# Patient Record
Sex: Female | Born: 1975 | Hispanic: No | Marital: Married | Smoking: Never smoker
Health system: Southern US, Community
[De-identification: ages and names within clinical notes are randomized; demographics above are authoritative.]

## PROBLEM LIST (undated history)

## (undated) DIAGNOSIS — M549 Dorsalgia, unspecified: Secondary | ICD-10-CM

## (undated) DIAGNOSIS — F32A Depression, unspecified: Secondary | ICD-10-CM

## (undated) DIAGNOSIS — G8929 Other chronic pain: Secondary | ICD-10-CM

## (undated) DIAGNOSIS — F419 Anxiety disorder, unspecified: Secondary | ICD-10-CM

## (undated) DIAGNOSIS — I639 Cerebral infarction, unspecified: Secondary | ICD-10-CM

---

## 2009-06-03 ENCOUNTER — Other Ambulatory Visit: Admission: RE | Admit: 2009-06-03 | Discharge: 2009-06-03 | Payer: Self-pay | Admitting: Family Medicine

## 2013-03-09 ENCOUNTER — Other Ambulatory Visit (HOSPITAL_COMMUNITY)
Admission: RE | Admit: 2013-03-09 | Discharge: 2013-03-09 | Disposition: A | Discharge status: Home / Self Care | Payer: Managed Care, Other (non HMO) | Source: Ambulatory Visit | Attending: Family Medicine | Admitting: Family Medicine

## 2013-03-09 ENCOUNTER — Other Ambulatory Visit: Payer: Self-pay | Admitting: Family Medicine

## 2013-03-09 DIAGNOSIS — Z124 Encounter for screening for malignant neoplasm of cervix: Secondary | ICD-10-CM | POA: Insufficient documentation

## 2015-02-11 ENCOUNTER — Emergency Department (HOSPITAL_COMMUNITY): Payer: Managed Care, Other (non HMO)

## 2015-02-11 ENCOUNTER — Emergency Department (HOSPITAL_COMMUNITY)
Admission: EM | Admit: 2015-02-11 | Discharge: 2015-02-11 | Disposition: A | Discharge status: Home / Self Care | Payer: Managed Care, Other (non HMO) | Attending: Emergency Medicine | Admitting: Emergency Medicine

## 2015-02-11 ENCOUNTER — Encounter (HOSPITAL_COMMUNITY): Payer: Self-pay | Admitting: *Deleted

## 2015-02-11 DIAGNOSIS — G8929 Other chronic pain: Secondary | ICD-10-CM | POA: Insufficient documentation

## 2015-02-11 DIAGNOSIS — M545 Low back pain, unspecified: Secondary | ICD-10-CM

## 2015-02-11 DIAGNOSIS — Z79899 Other long term (current) drug therapy: Secondary | ICD-10-CM | POA: Insufficient documentation

## 2015-02-11 DIAGNOSIS — Z3202 Encounter for pregnancy test, result negative: Secondary | ICD-10-CM | POA: Diagnosis not present

## 2015-02-11 DIAGNOSIS — M549 Dorsalgia, unspecified: Secondary | ICD-10-CM

## 2015-02-11 DIAGNOSIS — R32 Unspecified urinary incontinence: Secondary | ICD-10-CM

## 2015-02-11 DIAGNOSIS — M5020 Other cervical disc displacement, unspecified cervical region: Secondary | ICD-10-CM

## 2015-02-11 DIAGNOSIS — M5080 Other cervical disc disorders, unspecified cervical region: Secondary | ICD-10-CM | POA: Insufficient documentation

## 2015-02-11 HISTORY — DX: Dorsalgia, unspecified: M54.9

## 2015-02-11 HISTORY — DX: Other chronic pain: G89.29

## 2015-02-11 LAB — CBC WITH DIFFERENTIAL/PLATELET
Basophils Absolute: 0 10*3/uL (ref 0.0–0.1)
Basophils Relative: 0 % (ref 0–1)
EOS ABS: 0.1 10*3/uL (ref 0.0–0.7)
EOS PCT: 1 % (ref 0–5)
HEMATOCRIT: 43.3 % (ref 36.0–46.0)
HEMOGLOBIN: 14.5 g/dL (ref 12.0–15.0)
LYMPHS ABS: 1.8 10*3/uL (ref 0.7–4.0)
Lymphocytes Relative: 31 % (ref 12–46)
MCH: 30.1 pg (ref 26.0–34.0)
MCHC: 33.5 g/dL (ref 30.0–36.0)
MCV: 89.8 fL (ref 78.0–100.0)
MONOS PCT: 7 % (ref 3–12)
Monocytes Absolute: 0.4 10*3/uL (ref 0.1–1.0)
Neutro Abs: 3.4 10*3/uL (ref 1.7–7.7)
Neutrophils Relative %: 61 % (ref 43–77)
Platelets: 196 10*3/uL (ref 150–400)
RBC: 4.82 MIL/uL (ref 3.87–5.11)
RDW: 11.5 % (ref 11.5–15.5)
WBC: 5.7 10*3/uL (ref 4.0–10.5)

## 2015-02-11 LAB — URINALYSIS, ROUTINE W REFLEX MICROSCOPIC
BILIRUBIN URINE: NEGATIVE
Glucose, UA: NEGATIVE mg/dL
Hgb urine dipstick: NEGATIVE
Ketones, ur: 15 mg/dL — AB
Leukocytes, UA: NEGATIVE
NITRITE: NEGATIVE
PH: 6 (ref 5.0–8.0)
PROTEIN: NEGATIVE mg/dL
Specific Gravity, Urine: 1.012 (ref 1.005–1.030)
UROBILINOGEN UA: 0.2 mg/dL (ref 0.0–1.0)

## 2015-02-11 LAB — BASIC METABOLIC PANEL
ANION GAP: 8 (ref 5–15)
BUN: 11 mg/dL (ref 6–20)
CO2: 25 mmol/L (ref 22–32)
Calcium: 8.6 mg/dL — ABNORMAL LOW (ref 8.9–10.3)
Chloride: 107 mmol/L (ref 101–111)
Creatinine, Ser: 0.85 mg/dL (ref 0.44–1.00)
GLUCOSE: 82 mg/dL (ref 65–99)
POTASSIUM: 3.5 mmol/L (ref 3.5–5.1)
SODIUM: 140 mmol/L (ref 135–145)

## 2015-02-11 LAB — PREGNANCY, URINE: Preg Test, Ur: NEGATIVE

## 2015-02-11 MED ORDER — DIAZEPAM 5 MG/ML IJ SOLN
2.5000 mg | Freq: Once | INTRAMUSCULAR | Status: AC
Start: 2015-02-11 — End: 2015-02-11
  Administered 2015-02-11: 2.5 mg via INTRAVENOUS
  Filled 2015-02-11: qty 2

## 2015-02-11 MED ORDER — HYDROMORPHONE HCL 1 MG/ML IJ SOLN
1.0000 mg | Freq: Once | INTRAMUSCULAR | Status: AC
  Administered 2015-02-11: 1 mg via INTRAVENOUS
  Filled 2015-02-11: qty 1

## 2015-02-11 MED ORDER — PREDNISONE 10 MG PO TABS: 20.0000 mg | ORAL_TABLET | Freq: Every day | ORAL | Status: DC

## 2015-02-11 MED ORDER — OXYCODONE-ACETAMINOPHEN 5-325 MG PO TABS: 1.0000 | ORAL_TABLET | Freq: Four times a day (QID) | ORAL | Status: DC | PRN

## 2015-02-11 MED ORDER — DEXAMETHASONE SODIUM PHOSPHATE 10 MG/ML IJ SOLN
10.0000 mg | Freq: Once | INTRAMUSCULAR | Status: AC
  Administered 2015-02-11: 10 mg via INTRAVENOUS
  Filled 2015-02-11: qty 1

## 2015-02-11 MED ORDER — DIAZEPAM 5 MG PO TABS
5.0000 mg | ORAL_TABLET | Freq: Two times a day (BID) | ORAL | Status: DC
Start: 2015-02-11 — End: 2024-01-11

## 2015-02-11 NOTE — ED Notes (Signed)
PA at bedside.

## 2015-02-11 NOTE — Discharge Instructions (Signed)
Back Pain, Adult °Low back pain is very common. About 1 in 5 people have back pain. The cause of low back pain is rarely dangerous. The pain often gets better over time. About half of people with a sudden onset of back pain feel better in just 2 weeks. About 8 in 10 people feel better by 6 weeks.  °CAUSES °Some common causes of back pain include: °· Strain of the muscles or ligaments supporting the spine. °· Wear and tear (degeneration) of the spinal discs. °· Arthritis. °· Direct injury to the back. °DIAGNOSIS °Most of the time, the direct cause of low back pain is not known. However, back pain can be treated effectively even when the exact cause of the pain is unknown. Answering your caregiver's questions about your overall health and symptoms is one of the most accurate ways to make sure the cause of your pain is not dangerous. If your caregiver needs more information, he or she may order lab work or imaging tests (X-rays or MRIs). However, even if imaging tests show changes in your back, this usually does not require surgery. °HOME CARE INSTRUCTIONS °For many people, back pain returns. Since low back pain is rarely dangerous, it is often a condition that people can learn to manage on their own.  °· Remain active. It is stressful on the back to sit or stand in one place. Do not sit, drive, or stand in one place for more than 30 minutes at a time. Take short walks on level surfaces as soon as pain allows. Try to increase the length of time you walk each day. °· Do not stay in bed. Resting more than 1 or 2 days can delay your recovery. °· Do not avoid exercise or work. Your body is made to move. It is not dangerous to be active, even though your back may hurt. Your back will likely heal faster if you return to being active before your pain is gone. °· Pay attention to your body when you  bend and lift. Many people have less discomfort when lifting if they bend their knees, keep the load close to their bodies, and  avoid twisting. Often, the most comfortable positions are those that put less stress on your recovering back. °· Find a comfortable position to sleep. Use a firm mattress and lie on your side with your knees slightly bent. If you lie on your back, put a pillow under your knees. °· Only take over-the-counter or prescription medicines as directed by your caregiver. Over-the-counter medicines to reduce pain and inflammation are often the most helpful. Your caregiver may prescribe muscle relaxant drugs. These medicines help dull your pain so you can more quickly return to your normal activities and healthy exercise. °· Put ice on the injured area. °¨ Put ice in a plastic bag. °¨ Place a towel between your skin and the bag. °¨ Leave the ice on for 15-20 minutes, 03-04 times a day for the first 2 to 3 days. After that, ice and heat may be alternated to reduce pain and spasms. °· Ask your caregiver about trying back exercises and gentle massage. This may be of some benefit. °· Avoid feeling anxious or stressed. Stress increases muscle tension and can worsen back pain. It is important to recognize when you are anxious or stressed and learn ways to manage it. Exercise is a great option. °SEEK MEDICAL CARE IF: °· You have pain that is not relieved with rest or medicine. °· You have pain that does not improve in 1 week. °· You have new symptoms. °· You are generally not feeling well. °SEEK   IMMEDIATE MEDICAL CARE IF:   You have pain that radiates from your back into your legs.  You develop new bowel or bladder control problems.  You have unusual weakness or numbness in your arms or legs.  You develop nausea or vomiting.  You develop abdominal pain.  You feel faint. Document Released: 08/10/2005 Document Revised: 02/09/2012 Document Reviewed: 12/12/2013 [facility] Patient Information 2015 [place], [place]. This information is not intended to replace advice given to you by your health care provider. Make sure you  discuss any questions you have with your health care provider. Herniated Disk A herniated disk occurs when a disk in your spine bulges out too far. This condition is also called a ruptured disk or slipped disk. Your spine (backbone) is made up of bones called vertebrae. Between each pair of vertebrae is an oval disk with a soft, spongy center that acts as a shock absorber when you move. The spongy center is surrounded by a tough outer ring. When you have a herniated disk, the spongy center of the disk bulges out or ruptures through the outer ring. A herniated disk can press on a nerve between your vertebrae and cause pain. A herniated disk can occur anywhere in your back or neck area, but the lower back is the most common spot. CAUSES  In many cases, a herniated disk occurs just from getting older. As you age, the spongy insides of your disks tend to shrink and dry out. A herniated disk can result from gradual wear and tear. Injury or sudden strain can also cause a herniated disk.  RISK FACTORS Aging is the main risk factor for a herniated disk. Other risk factors include:  Being a man between the ages of 35 and 60 years.  Having a job that requires heavy lifting, bending, or twisting.  Having a job that requires long hours of driving.  Not getting enough exercise.  Being overweight.  Smoking. SIGNS AND SYMPTOMS  Signs and symptoms depend on which disk is herniated.  For a herniated disk in the lower back, you may have sharp pain in:  One part of your leg, hip, or buttocks.  The back of your calf.  The top or sole of your foot (sciatica).   For a herniated disk in the neck, you may feel pain:  When you move your neck.  Near or over your shoulder blade.  That moves to your upper arm, forearm, or fingers.   You may also have muscle weakness. It may be hard to:  Lift your leg or arm.  Stand on your toes.  Squeeze tightly with one of your hands.  Other symptoms can  include:  Numbness or tingling in the affected areas of your body.  Loss of bladder or bowel control. This is a rare but serious sign of a severe herniated disk in the lower back. DIAGNOSIS  Your health care provider will do a physical exam. During this exam, you may have to move certain body parts or assume various positions. For example, your health care provider may do the straight-leg test. This is a good way to test for a herniated disk in your lower back. In this test, the health care provider lifts your leg while you lie on your back. This is to see if you feel pain down your leg. Your health care provider will also check for numbness or loss of feeling.  Your health care provider will also check your:  Reflexes.  Muscle strength.  Posture.  Other tests may be done to help in making a diagnosis. These may include:  An X-ray of the spine to rule out other causes of back pain.   Other imaging studies, such as an MRI or CT scan. This is to check whether the herniated disk is pressing on your spinal canal.  Electromyography (EMG). This test checks the nerves that control muscles. It is sometimes used to identify the specific area of nerve involvement.  TREATMENT  In many cases, herniated disk symptoms go away over a period of days or weeks. You will most likely be free of symptoms in 3-4 months. Treatment may include the following:  The initial treatment for a herniated disk is ashort period of rest.  Bed rest is often limited to 1 or 2 days. Resting for too long delays recovery.  If you have a herniated disk in your lower back, you should avoid sitting as much as possible because sitting increases pressure on the disk.  Medicines. These may include:   Nonsteroidal anti-inflammatory drugs (NSAIDs).  Muscle relaxants for back spasms.  Narcotic pain medicine if your pain is very bad.   Steroid injections. You may need these along the involved nerve root to help control  pain. The steroid is injected in the area of the herniated disk. It helps by reducing swelling around the disk.  Physical therapy. This may include exercises to strengthen the muscles that help support your spine.   You may need surgery if other treatments do not work.  HOME CARE INSTRUCTIONS Follow all your health care provider's instructions. These may include:  Take all medicines as directed by your health care provider.  Rest for 2 days and then start moving.  Do not sit or stand for long periods of time.  Maintain good posture when sitting and standing.  Avoid movements that cause pain, such as bending or lifting.  When you are able to start lifting things again:  [place] with your knees.  Keep your back straight.  Hold heavy objects close to your body.  If you are overweight, ask your health care provider to help you start a weight-loss program.  When you are able to start exercising, ask your health care provider how much and what type of exercise is best for you.  Work with a physical therapist on stretching and strengthening exercises for your back.  Do not wear high-heeled shoes.  Do not sleep on your belly.  Do not smoke.  Keep all follow-up visits as directed by your health care provider. SEEK MEDICAL CARE IF:  You have back or neck pain that is not getting better after 4 weeks.  You have very bad pain in your back or neck.  You develop numbness, tingling, or weakness along with pain. SEEK IMMEDIATE MEDICAL CARE IF:   You have numbness, tingling, or weakness that makes you unable to use your arms or legs.  You lose control of your bladder or bowels.  You have dizziness or fainting.  You have shortness of breath.  MAKE SURE YOU:   Understand these instructions.  Will watch your condition.  Will get help right away if you are not doing well or get worse. Document Released: 08/07/2000 Document Revised: 12/25/2013 Document Reviewed:  07/14/2013 [facility] Patient Information 2015 [place], [place]. This information is not intended to replace advice given to you by your health care provider. Make sure you discuss any questions you have with your health care provider.    Herniated Disk A herniated  disk occurs when a disk in your spine bulges out too far. This condition is also called a ruptured disk or slipped disk. Your spine (backbone) is made up of bones called vertebrae. Between each pair of vertebrae is an oval disk with a soft, spongy center that acts as a shock absorber when you move. The spongy center is surrounded by a tough outer ring. When you have a herniated disk, the spongy center of the disk bulges out or ruptures through the outer ring. A herniated disk can press on a nerve between your vertebrae and cause pain. A herniated disk can occur anywhere in your back or neck area, but the lower back is the most common spot. CAUSES  In many cases, a herniated disk occurs just from getting older. As you age, the spongy insides of your disks tend to shrink and dry out. A herniated disk can result from gradual wear and tear. Injury or sudden strain can also cause a herniated disk.  RISK FACTORS Aging is the main risk factor for a herniated disk. Other risk factors include:  Being a man between the ages of 55 and 64 years.  Having a job that requires heavy lifting, bending, or twisting.  Having a job that requires long hours of driving.  Not getting enough exercise.  Being overweight.  Smoking. SIGNS AND SYMPTOMS  Signs and symptoms depend on which disk is herniated.  For a herniated disk in the lower back, you may have sharp pain in:  One part of your leg, hip, or buttocks.  The back of your calf.  The top or sole of your foot (sciatica).   For a herniated disk in the neck, you may feel pain:  When you move your neck.  Near or over your shoulder blade.  That moves to your upper arm, forearm, or fingers.    You may also have muscle weakness. It may be hard to:  Lift your leg or arm.  Stand on your toes.  Squeeze tightly with one of your hands.  Other symptoms can include:  Numbness or tingling in the affected areas of your body.  Loss of bladder or bowel control. This is a rare but serious sign of a severe herniated disk in the lower back. DIAGNOSIS  Your health care provider will do a physical exam. During this exam, you may have to move certain body parts or assume various positions. For example, your health care provider may do the straight-leg test. This is a good way to test for a herniated disk in your lower back. In this test, the health care provider lifts your leg while you lie on your back. This is to see if you feel pain down your leg. Your health care provider will also check for numbness or loss of feeling.  Your health care provider will also check your:  Reflexes.  Muscle strength.  Posture.  Other tests may be done to help in making a diagnosis. These may include:  An X-ray of the spine to rule out other causes of back pain.   Other imaging studies, such as an MRI or CT scan. This is to check whether the herniated disk is pressing on your spinal canal.  Electromyography (EMG). This test checks the nerves that control muscles. It is sometimes used to identify the specific area of nerve involvement.  TREATMENT  In many cases, herniated disk symptoms go away over a period of days or weeks. You will most likely be free of  symptoms in 3-4 months. Treatment may include the following:  The initial treatment for a herniated disk is ashort period of rest.  Bed rest is often limited to 1 or 2 days. Resting for too long delays recovery.  If you have a herniated disk in your lower back, you should avoid sitting as much as possible because sitting increases pressure on the disk.  Medicines. These may include:   Nonsteroidal anti-inflammatory drugs  (NSAIDs).  Muscle relaxants for back spasms.  Narcotic pain medicine if your pain is very bad.   Steroid injections. You may need these along the involved nerve root to help control pain. The steroid is injected in the area of the herniated disk. It helps by reducing swelling around the disk.  Physical therapy. This may include exercises to strengthen the muscles that help support your spine.   You may need surgery if other treatments do not work.  HOME CARE INSTRUCTIONS Follow all your health care provider's instructions. These may include:  Take all medicines as directed by your health care provider.  Rest for 2 days and then start moving.  Do not sit or stand for long periods of time.  Maintain good posture when sitting and standing.  Avoid movements that cause pain, such as bending or lifting.  When you are able to start lifting things again:  [place] with your knees.  Keep your back straight.  Hold heavy objects close to your body.  If you are overweight, ask your health care provider to help you start a weight-loss program.  When you are able to start exercising, ask your health care provider how much and what type of exercise is best for you.  Work with a physical therapist on stretching and strengthening exercises for your back.  Do not wear high-heeled shoes.  Do not sleep on your belly.  Do not smoke.  Keep all follow-up visits as directed by your health care provider. SEEK MEDICAL CARE IF:  You have back or neck pain that is not getting better after 4 weeks.  You have very bad pain in your back or neck.  You develop numbness, tingling, or weakness along with pain. SEEK IMMEDIATE MEDICAL CARE IF:   You have numbness, tingling, or weakness that makes you unable to use your arms or legs.  You lose control of your bladder or bowels.  You have dizziness or fainting.  You have shortness of breath.  MAKE SURE YOU:   Understand these  instructions.  Will watch your condition.  Will get help right away if you are not doing well or get worse. Document Released: 08/07/2000 Document Revised: 12/25/2013 Document Reviewed: 07/14/2013 [facility] Patient Information 2015 [place], [place]. This information is not intended to replace advice given to you by your health care provider. Make sure you discuss any questions you have with your health care provider.

## 2015-02-11 NOTE — ED Notes (Signed)
Pt returned from MRI °

## 2015-02-11 NOTE — ED Provider Notes (Signed)
CSN: [id number]     Arrival date & time 02/11/15  1436 History   First MD Initiated Contact with Patient 02/11/15 1510     Chief Complaint  Patient presents with  . Back Pain     (Consider location/radiation/quality/duration/timing/severity/associated sxs/prior Treatment) HPI   PCP: Allean Found, MD Blood pressure 140/71, pulse 63, temperature 98.8 F (37.1 C), temperature source Oral, resp. rate 16, last menstrual period 01/23/2015, SpO2 100 %.  Patty [name] is a 39 y.o.female with a significant PMH of chronic back pain presents to the ER with complaints of low back pain at a 10/10 with movement. She has chronic low back pain with a known hx of herniated disc, yesterday she bent down to pick something up but on the way down had severe back pain. She has not ambulated since then and has been stuck in bed.  She describes having urine incontinence, she has been taking tissue to urinate into in bed due to being unable to get up, she has to push very hard to get the urine out but does not feel like everything drains from her bladder. She woke up today and had urinated on herself in her sleep without knowing. She denies having any bowel movements.  The patient denies diaphoresis, fever, headache, hx of IV drug use, weakness (general or focal), confusion, change of vision,  neck pain, dysphagia, aphagia, chest pain, shortness of breath,  abdominal pains, nausea, vomiting, diarrhea, lower extremity swelling, rash.  Past Medical History  Diagnosis Date  . Chronic back pain     officially undiagnosed   History reviewed. No pertinent past surgical history. No family history on file. History  Substance Use Topics  . Smoking status: Never Smoker   . Smokeless tobacco: Not on file  . Alcohol Use: No   OB History    No data available     Review of Systems  10 Systems reviewed and are negative for acute change except as noted in the HPI.     Allergies  Review of patient's  allergies indicates no known allergies.  Home Medications   Prior to Admission medications   Medication Sig Start Date End Date Taking? Authorizing Provider  cyclobenzaprine (FLEXERIL) 10 MG tablet Take 10 mg by mouth 3 (three) times daily as needed for muscle spasms.  02/11/15  Yes Historical Provider, MD  HYDROcodone-acetaminophen (NORCO/VICODIN) 5-325 MG per tablet Take 1 tablet by mouth once.   Yes Historical Provider, MD  ibuprofen (ADVIL,MOTRIN) 200 MG tablet Take 600 mg by mouth every 6 (six) hours as needed for headache, mild pain or moderate pain.   Yes Historical Provider, MD  SRONYX 0.1-20 MG-MCG tablet Take 1 tablet by mouth daily. 01/22/15  Yes Historical Provider, MD  diazepam (VALIUM) 5 MG tablet Take 1 tablet (5 mg total) by mouth 2 (two) times daily. 02/11/15   Marlon Pel, PA-C  oxyCODONE-acetaminophen (PERCOCET/ROXICET) 5-325 MG per tablet Take 1-2 tablets by mouth every 6 (six) hours as needed for severe pain. 02/11/15   Eladio Dentremont Neva Seat, PA-C  predniSONE (DELTASONE) 10 MG tablet Take 2 tablets (20 mg total) by mouth daily. 02/11/15   Mackensi Mahadeo Neva Seat, PA-C   BP 119/67 mmHg  Pulse 72  Temp(Src) 98.4 F (36.9 C) (Oral)  Resp 20  SpO2 99%  LMP 01/23/2015 (Approximate) Physical Exam  Constitutional: She appears well-developed and well-nourished. No distress.  HENT:  Head: Normocephalic and atraumatic.  Eyes: Pupils are equal, round, and reactive to light.  Neck: Normal range of motion.  Neck supple.  Cardiovascular: Normal rate and regular rhythm.   Pulmonary/Chest: Effort normal.  Abdominal: Soft.  Genitourinary:  Normal rectal sphincter tone.  Musculoskeletal:       Back:  Pain is not made worse by palpation. 2+/2+ patellar reflexes 2+/2+ achilles reflexes   Pt has equal strength to bilateral lower extremities.  Neurosensory function adequate to both legs No clonus on dorsiflextion Skin color is normal. Skin is warm and moist.  I see no step off deformity, no  midline bony tenderness.  Pt is able to ambulate.  No crepitus, laceration, effusion, induration, lesions, swelling.   Pedal pulses are symmetrical and palpable bilaterally    Neurological: She is alert.  Skin: Skin is warm and dry.  Nursing note and vitals reviewed.   ED Course  Procedures (including critical care time) Labs Review Labs Reviewed  BASIC METABOLIC PANEL - Abnormal; Notable for the following:    Calcium 8.6 (*)    All other components within normal limits  URINALYSIS, ROUTINE W REFLEX MICROSCOPIC (NOT AT [facility]) - Abnormal; Notable for the following:    Ketones, ur 15 (*)    All other components within normal limits  CBC WITH DIFFERENTIAL/PLATELET  PREGNANCY, URINE    Imaging Review Mr Thoracic Spine Wo Contrast  02/11/2015   CLINICAL DATA:  39 year old female with 10 out of 10 thoracolumbar back pain and urinary incontinence. Unable to walk. Initial encounter.  EXAM: MRI THORACIC AND LUMBAR SPINE WITHOUT CONTRAST  TECHNIQUE: Multiplanar and multiecho pulse sequences of the thoracic and lumbar spine were obtained without intravenous contrast.  COMPARISON:  None.  FINDINGS: MR THORACIC SPINE FINDINGS  Metal susceptibility artifact about the face might be related to dental braces. Limited sagittal imaging of the cervical spine is remarkable for disc space loss and disc bulge at C5-C6.  Normal thoracic vertebral height, alignment, and bone marrow signal. No marrow edema or evidence of acute osseous abnormality. Spinal cord signal is within normal limits at all visualized levels. The conus medullaris appears normal at T12-L1. Normal thoracic intervertebral disc signal and morphology. No thoracic disc herniation. No spinal or foraminal stenosis.  Negative visualized thoracic and upper abdominal viscera. Negative visualized posterior paraspinal soft tissues.  MR LUMBAR SPINE FINDINGS  Normal lumbar segmentation. Normal lumbar vertebral height and alignment.  Visualized lower thoracic  spinal cord is normal with conus medularis at L1.  Visualized abdominal viscera and paraspinal soft tissues are within normal limits.  T12-L1: Negative.  L1-L2: Negative.  L2-L3: Minimal disc desiccation. Mild facet hypertrophy. No stenosis.  L3-L4: Minimal disc desiccation.  L4-L5: Disc desiccation and circumferential disc bulge with superimposed small central disc protrusion with annular fissure (series 6, image 6 and series 7, image 24). Disc material in proximity to the descending L5 nerve roots in the lateral recess. No significant spinal stenosis. No L4 foraminal involvement identified.  L5-S1:  Negative.  IMPRESSION: MR THORACIC SPINE IMPRESSION  1. Normal thoracic spine MRI. 2. C5-C6 disc bulge.  MR LUMBAR SPINE IMPRESSION  1. L4-L5 disc degeneration with a small broad-based central disc herniation, most affecting the lateral recesses at the level of the L5 nerve roots. 2. No lumbar spinal stenosis and minimal Lumbar spine degeneration otherwise.   Electronically Signed   By: Odessa Fleming M.D.   On: 02/11/2015 20:51   Mr Lumbar Spine Wo Contrast  02/11/2015   CLINICAL DATA:  39 year old female with 10 out of 10 thoracolumbar back pain and urinary incontinence. Unable to walk. Initial encounter.  EXAM: MRI THORACIC AND LUMBAR SPINE WITHOUT CONTRAST  TECHNIQUE: Multiplanar and multiecho pulse sequences of the thoracic and lumbar spine were obtained without intravenous contrast.  COMPARISON:  None.  FINDINGS: MR THORACIC SPINE FINDINGS  Metal susceptibility artifact about the face might be related to dental braces. Limited sagittal imaging of the cervical spine is remarkable for disc space loss and disc bulge at C5-C6.  Normal thoracic vertebral height, alignment, and bone marrow signal. No marrow edema or evidence of acute osseous abnormality. Spinal cord signal is within normal limits at all visualized levels. The conus medullaris appears normal at T12-L1. Normal thoracic intervertebral disc signal and  morphology. No thoracic disc herniation. No spinal or foraminal stenosis.  Negative visualized thoracic and upper abdominal viscera. Negative visualized posterior paraspinal soft tissues.  MR LUMBAR SPINE FINDINGS  Normal lumbar segmentation. Normal lumbar vertebral height and alignment.  Visualized lower thoracic spinal cord is normal with conus medularis at L1.  Visualized abdominal viscera and paraspinal soft tissues are within normal limits.  T12-L1: Negative.  L1-L2: Negative.  L2-L3: Minimal disc desiccation. Mild facet hypertrophy. No stenosis.  L3-L4: Minimal disc desiccation.  L4-L5: Disc desiccation and circumferential disc bulge with superimposed small central disc protrusion with annular fissure (series 6, image 6 and series 7, image 24). Disc material in proximity to the descending L5 nerve roots in the lateral recess. No significant spinal stenosis. No L4 foraminal involvement identified.  L5-S1:  Negative.  IMPRESSION: MR THORACIC SPINE IMPRESSION  1. Normal thoracic spine MRI. 2. C5-C6 disc bulge.  MR LUMBAR SPINE IMPRESSION  1. L4-L5 disc degeneration with a small broad-based central disc herniation, most affecting the lateral recesses at the level of the L5 nerve roots. 2. No lumbar spinal stenosis and minimal Lumbar spine degeneration otherwise.   Electronically Signed   By: Odessa Fleming M.D.   On: 02/11/2015 20:51     EKG Interpretation None     MDM   Final diagnoses:  Back pain  Urine incontinence  Midline low back pain without sciatica  Cervical disc herniation    I spoke with Dr. Thad Ranger regarding the patients back pain. Her pain is at L2-L3, therefore she feels thoracic and lumbar necessary for complete evalaution.  The patients MRI of thoracic and lumbar shows some non acute changes.   IMPRESSION: MR THORACIC SPINE IMPRESSION  1. Normal thoracic spine MRI. 2. C5-C6 disc bulge.  MR LUMBAR SPINE IMPRESSION  1. L4-L5 disc degeneration with a small broad-based central  disc herniation, most affecting the lateral recesses at the level of the L5 nerve roots. 2. No lumbar spinal stenosis and minimal Lumbar spine degeneration otherwise.   Electronically Signed By: Odessa Fleming M.D. On: 02/11/2015 20:51          Medications  HYDROmorphone (DILAUDID) injection 1 mg (1 mg Intravenous Given 02/11/15 1629)  diazepam (VALIUM) injection 2.5 mg (2.5 mg Intravenous Given 02/11/15 1629)  dexamethasone (DECADRON) injection 10 mg (10 mg Intravenous Given 02/11/15 1629)  HYDROmorphone (DILAUDID) injection 1 mg (1 mg Intravenous Given 02/11/15 1834)  HYDROmorphone (DILAUDID) injection 1 mg (1 mg Intravenous Given 02/11/15 2125)    No cauda equina or cord compression.  Her sphincter tone is physiologic, she has been able to pass urine appropriately in the ED using a bed pain. She ambulated with assistance, although with some discomfort with the nurse. Patient says she feels that she is okay to go home as long as she has bed pains to help.  Discussed PCP  vs Neuro vs Ortho, she will follow-up with PCP or Orthopedic.  38 y.o.Althia Toole's  with back pain. No neurological deficits and normal neuro exam. Patient can walk. No loss of bowel or bladder control. No concern for cauda equina at this time base on HPI and physical exam findings. No fever, night sweats, weight loss, h/o cancer, IVDU.   Patient Plan 1. Medications: steroid, pain medication and muscle relaxer. Cont usual home medications unless otherwise directed. 2. Treatment: rest, drink plenty of fluids, gentle stretching as discussed, alternate ice and heat  3. Follow Up: Please followup with your primary doctor for discussion of your diagnoses and further evaluation after today's visit; if you do not have a primary care doctor use the resource guide provided to find one  Advised to follow-up with the orthopedist if symptoms do not start to resolve in the next 2-3 days. If develop loss of bowel or urinary  control return to the ED as soon as possible for further evaluation. To take the medications as prescribed as they can cause harm if not taken appropriately.   Vital signs are stable at discharge. Filed Vitals:   02/11/15 2116  BP: 119/67  Pulse: 72  Temp:   Resp: 20    Patient/guardian has voiced understanding and agreed to follow-up with the PCP or specialist.         Marlon Pel, PA-C 02/11/15 2213  Mancel Bale, MD 02/11/15 [phone]

## 2015-02-11 NOTE — ED Notes (Signed)
Bed: [facility] Expected date:  Expected time:  Means of arrival:  Comments: EMS back pain 

## 2015-02-11 NOTE — ED Notes (Signed)
EMS contacted d/t patients c/o lower back pain 10/10. Pt has had some intermittent back pain over the last 8 years or so but has never been this severe. Pt reports bending over and pain onset. Since then pt has not been able to walk and has been bed bound. Pt has not been able to ambulate to rest room in home, she reports several episodes of incontinence. She feels she is unable to empty her bladder. 150 mcg Fentanyl given in the field

## 2015-02-11 NOTE — ED Notes (Signed)
Pt remains in MRI 

## 2015-02-11 NOTE — ED Notes (Signed)
Patient transported to MRI 

## 2015-02-11 NOTE — ED Notes (Signed)
Pt alert and oriented x4. Respirations even and unlabored, bilateral symmetrical rise and fall of chest. Skin warm and dry. In no acute distress. Denies needs.   

## 2015-02-11 NOTE — ED Notes (Signed)
Pt was bladder scanned, 455 cc. Pt reports feeling that she needs to void.

## 2016-07-20 ENCOUNTER — Other Ambulatory Visit: Payer: Self-pay | Admitting: Physician Assistant

## 2016-07-20 ENCOUNTER — Other Ambulatory Visit (HOSPITAL_COMMUNITY)
Admission: RE | Admit: 2016-07-20 | Discharge: 2016-07-20 | Disposition: A | Discharge status: Home / Self Care | Payer: Managed Care, Other (non HMO) | Source: Ambulatory Visit | Attending: Physician Assistant | Admitting: Physician Assistant

## 2016-07-20 DIAGNOSIS — Z01419 Encounter for gynecological examination (general) (routine) without abnormal findings: Secondary | ICD-10-CM | POA: Insufficient documentation

## 2016-07-20 DIAGNOSIS — Z1151 Encounter for screening for human papillomavirus (HPV): Secondary | ICD-10-CM | POA: Diagnosis not present

## 2016-07-22 LAB — CYTOLOGY - PAP
Diagnosis: NEGATIVE
HPV: NOT DETECTED

## 2016-10-24 ENCOUNTER — Other Ambulatory Visit (INDEPENDENT_AMBULATORY_CARE_PROVIDER_SITE_OTHER): Payer: Self-pay | Admitting: Orthopaedic Surgery

## 2016-10-24 MED ORDER — METHOCARBAMOL 500 MG PO TABS: 500.0000 mg | ORAL_TABLET | Freq: Four times a day (QID) | ORAL | 2 refills | Status: DC | PRN

## 2016-10-24 MED ORDER — PREDNISONE 10 MG (21) PO TBPK: ORAL_TABLET | ORAL | 0 refills | Status: DC

## 2017-08-30 ENCOUNTER — Other Ambulatory Visit (INDEPENDENT_AMBULATORY_CARE_PROVIDER_SITE_OTHER): Payer: Self-pay

## 2017-08-30 ENCOUNTER — Telehealth (INDEPENDENT_AMBULATORY_CARE_PROVIDER_SITE_OTHER): Payer: Self-pay | Admitting: Orthopaedic Surgery

## 2017-08-30 ENCOUNTER — Telehealth (INDEPENDENT_AMBULATORY_CARE_PROVIDER_SITE_OTHER): Payer: Self-pay | Admitting: Radiology

## 2017-08-30 MED ORDER — MELOXICAM 7.5 MG PO TABS: 7.5000 mg | ORAL_TABLET | Freq: Two times a day (BID) | ORAL | 0 refills | Status: DC

## 2017-08-30 MED ORDER — PREDNISONE 10 MG (21) PO TBPK: ORAL_TABLET | ORAL | 0 refills | Status: DC

## 2017-08-30 NOTE — Telephone Encounter (Signed)
Patient called again, I sent meds to pharm for her.

## 2017-08-30 NOTE — Telephone Encounter (Signed)
Patient called wanting to know if she could get a refill on her prednisone and meloxicam. CB #  [phone] 

## 2017-08-30 NOTE — Telephone Encounter (Signed)
Mobic 7.5 bid prn pain #30.  Prednisone taper 10 mg.  21 tabs

## 2017-08-30 NOTE — Telephone Encounter (Signed)
Please advise 

## 2017-08-30 NOTE — Telephone Encounter (Signed)
Patient called wanting to know if she could get a refill on her prednisone and meloxicam. CB #  [phone]

## 2017-08-30 NOTE — Telephone Encounter (Signed)
Patient called and LMVM triage that her back started having muscle spasms last night and she wants to see if Dr Roda ShuttersXu will give her a steroid.  She is taking an expired Rx Mobic at this time.  Please call her to discuss/advise.

## 2017-08-30 NOTE — Telephone Encounter (Signed)
Patient called to check the status of her prescription refill.  She stated that she is in a bit of medical emergency and is unable to get up or walk.  Thank you

## 2017-08-30 NOTE — Telephone Encounter (Signed)
Do you want to give her the dose pak or the 10 mg?  And I do not see any Meloxicam given to her before. How many mg? And  quantity would you like to give her ?

## 2017-08-30 NOTE — Telephone Encounter (Signed)
yes

## 2017-08-30 NOTE — Addendum Note (Signed)
Addended by: Cherre HugerMAY, Jazlyne Gauger E on: 08/30/2017 02:05 PM   Modules accepted: Orders

## 2017-09-10 ENCOUNTER — Telehealth (INDEPENDENT_AMBULATORY_CARE_PROVIDER_SITE_OTHER): Payer: Self-pay | Admitting: Orthopaedic Surgery

## 2017-09-10 ENCOUNTER — Encounter (INDEPENDENT_AMBULATORY_CARE_PROVIDER_SITE_OTHER): Payer: Self-pay | Admitting: Orthopaedic Surgery

## 2017-09-10 ENCOUNTER — Ambulatory Visit (INDEPENDENT_AMBULATORY_CARE_PROVIDER_SITE_OTHER): Payer: Managed Care, Other (non HMO) | Admitting: Orthopaedic Surgery

## 2017-09-10 DIAGNOSIS — G8929 Other chronic pain: Secondary | ICD-10-CM | POA: Diagnosis not present

## 2017-09-10 DIAGNOSIS — M5442 Lumbago with sciatica, left side: Secondary | ICD-10-CM | POA: Diagnosis not present

## 2017-09-10 MED ORDER — CYCLOBENZAPRINE HCL 5 MG PO TABS: 5.0000 mg | ORAL_TABLET | Freq: Three times a day (TID) | ORAL | 3 refills | Status: DC | PRN

## 2017-09-10 MED ORDER — PREDNISONE 10 MG (21) PO TBPK: ORAL_TABLET | ORAL | 0 refills | Status: DC

## 2017-09-10 MED ORDER — METHOCARBAMOL 500 MG PO TABS: 500.0000 mg | ORAL_TABLET | Freq: Four times a day (QID) | ORAL | 2 refills | Status: DC | PRN

## 2017-09-10 MED ORDER — HYDROCODONE-ACETAMINOPHEN 5-325 MG PO TABS: 1.0000 | ORAL_TABLET | Freq: Every day | ORAL | 0 refills | Status: DC | PRN

## 2017-09-10 NOTE — Telephone Encounter (Signed)
[facility] called stating that Dr. Roda ShuttersXu sent over two prescriptions, one for Flexeril and the other for Methocarbamol.  Which one do you want them to fill.  CB#[phone].  Thank you.

## 2017-09-10 NOTE — Telephone Encounter (Signed)
Per Dr Roda ShuttersXu she needs both. Called Pharmacy to advise.

## 2017-09-10 NOTE — Progress Notes (Signed)
Office Visit Note   Patient: Patty [name]           Date of Birth: 1976/05/12           MRN: [id number] Visit Date: 09/10/2017              Requested by: Merri Brunette, MD [phone] [place], [place] [address] PCP: Merri Brunette, MD   Assessment & Plan: Visit Diagnoses:  1. Chronic left-sided low back pain with left-sided sciatica     Plan: At this point we feel it appropriate to repeat her MRI of the lumbar spine.  We will be in touch with her over the phone once this is completed. Total face to face encounter time was greater than 25 minutes and over half of this time was spent in counseling and/or coordination of care.  Follow-Up Instructions: Return if symptoms worsen or fail to improve.   Orders:  Orders Placed This Encounter  Procedures  . MR Lumbar Spine w/o contrast   Meds ordered this encounter  Medications  . methocarbamol (ROBAXIN) 500 MG tablet    Sig: Take 1 tablet (500 mg total) by mouth every 6 (six) hours as needed for muscle spasms.    Dispense:  10 tablet    Refill:  2  . cyclobenzaprine (FLEXERIL) 5 MG tablet    Sig: Take 1-2 tablets (5-10 mg total) by mouth 3 (three) times daily as needed for muscle spasms.    Dispense:  10 tablet    Refill:  3  . HYDROcodone-acetaminophen (NORCO) 5-325 MG tablet    Sig: Take 1-2 tablets by mouth daily as needed.    Dispense:  10 tablet    Refill:  0  . predniSONE (STERAPRED UNI-PAK 21 TAB) 10 MG (21) TBPK tablet    Sig: Take as directed    Dispense:  21 tablet    Refill:  0      Procedures: No procedures performed   Clinical Data: No additional findings.   Subjective: Chief Complaint  Patient presents with  . Lower Back - Pain    HPI comes in for recurrent lower back pain.  This initially began back in 2009 when backpacking with the baby on her back.  No specific injury, however.  The pain started in her left buttock.  She was given a steroid Dosepak as well as core strengthening  exercises.  This significantly helped.  She notes that she gets a "flareup "approximately once a year, but this is become more frequent.  She states that about 2 years ago she had such terrible pain she was unable to get out of the bed.  The most recent flareup was a few weeks ago.  She was given muscle relaxers as well as Mobic and a steroid Dosepak which significantly helped.  She does note intermittent numbness to the right toe.well-developed well-nourished female in no acute distress.  Alert and oriented x3.  No previous epidural steroid injection.  Of note she does have an MRI of her lumbar spine from 2016 which showed a small broad-based disc bulge at L4-5.    Review of Systems   Objective: Vital Signs: There were no vitals taken for this visit.  Physical Exam well-developed well-nourished female in no acute distress.  Alert and oriented x3.  Ortho Exam examination of her lumbar spine reveals no spinous or paraspinous tenderness.  Pain with flexion greater than extension of the lumbar spine.  Positive straight leg raise on the left.  Negative on the right.  Specialty Comments:  No specialty comments available.  Imaging: No results found.   PMFS History: There are no active problems to display for this patient.  Past Medical History:  Diagnosis Date  . Chronic back pain    officially undiagnosed    History reviewed. No pertinent family history.  History reviewed. No pertinent surgical history. Social History   Occupational History  . Not on file  Tobacco Use  . Smoking status: Never Smoker  . Smokeless tobacco: Never Used  Substance and Sexual Activity  . Alcohol use: No  . Drug use: No  . Sexual activity: Not on file

## 2017-09-21 ENCOUNTER — Telehealth (INDEPENDENT_AMBULATORY_CARE_PROVIDER_SITE_OTHER): Payer: Self-pay | Admitting: Orthopaedic Surgery

## 2017-09-21 NOTE — Telephone Encounter (Signed)
Patient called and stated that insurance denied the lumbar MRI because non necessity.  Patient wants to know if this can be resubmitted.  Please call patient to advise.

## 2017-09-21 NOTE — Telephone Encounter (Signed)
Yes let's do peer to peer.  Thanks.  Please let her know.

## 2017-09-21 NOTE — Telephone Encounter (Signed)
Please advise 

## 2017-09-22 NOTE — Telephone Encounter (Signed)
Is there a new message that I didn't see?

## 2017-09-22 NOTE — Telephone Encounter (Signed)
See message below °

## 2017-09-22 NOTE — Telephone Encounter (Signed)
Ment to send it to sabrina. Tried to Call patient no answer phone seems to be disconnected.

## 2017-09-23 NOTE — Telephone Encounter (Signed)
[place] message to [place] advising needs peer to peer.

## 2017-10-26 ENCOUNTER — Telehealth (INDEPENDENT_AMBULATORY_CARE_PROVIDER_SITE_OTHER): Payer: Self-pay

## 2017-10-26 NOTE — Telephone Encounter (Signed)
[facility]  Auth Number: [id number]

## 2017-10-27 ENCOUNTER — Encounter (INDEPENDENT_AMBULATORY_CARE_PROVIDER_SITE_OTHER): Payer: Self-pay | Admitting: *Deleted

## 2017-10-27 NOTE — Telephone Encounter (Signed)
Noted in referral.

## 2017-11-05 ENCOUNTER — Ambulatory Visit
Admission: RE | Admit: 2017-11-05 | Discharge: 2017-11-05 | Disposition: A | Discharge status: Home / Self Care | Payer: Managed Care, Other (non HMO) | Source: Ambulatory Visit | Attending: Orthopaedic Surgery | Admitting: Orthopaedic Surgery

## 2017-11-05 DIAGNOSIS — G8929 Other chronic pain: Secondary | ICD-10-CM

## 2017-11-05 DIAGNOSIS — M5442 Lumbago with sciatica, left side: Principal | ICD-10-CM

## 2017-11-10 ENCOUNTER — Telehealth (INDEPENDENT_AMBULATORY_CARE_PROVIDER_SITE_OTHER): Payer: Self-pay

## 2017-11-10 NOTE — Telephone Encounter (Signed)
Per Dr Roda ShuttersXu   "Please let her know the MRI is stable. No worsening or acute changes. "   Tried to call patient no answer.

## 2017-11-10 NOTE — Telephone Encounter (Signed)
I called and spoke with her.

## 2017-11-10 NOTE — Telephone Encounter (Signed)
Please advise 

## 2017-11-10 NOTE — Telephone Encounter (Signed)
Please call pt to discuss pt care. I did verify pt MRI results and she would like to know if she needed further care.

## 2018-09-09 ENCOUNTER — Telehealth (INDEPENDENT_AMBULATORY_CARE_PROVIDER_SITE_OTHER): Payer: Self-pay | Admitting: Orthopaedic Surgery

## 2018-09-09 NOTE — Telephone Encounter (Signed)
Patient called needing Rx refilled  (Prednisone) 10 mg tab pack for muscle spasm. The number to contact patient is [phone]

## 2018-09-09 NOTE — Telephone Encounter (Signed)
Please advise 

## 2018-09-10 MED ORDER — PREDNISONE 10 MG (21) PO TBPK: ORAL_TABLET | ORAL | 0 refills | Status: DC

## 2019-06-13 ENCOUNTER — Other Ambulatory Visit (INDEPENDENT_AMBULATORY_CARE_PROVIDER_SITE_OTHER): Payer: Self-pay | Admitting: Orthopaedic Surgery

## 2019-11-18 ENCOUNTER — Ambulatory Visit: Payer: Managed Care, Other (non HMO)

## 2021-04-04 ENCOUNTER — Other Ambulatory Visit: Payer: Self-pay | Admitting: Physician Assistant

## 2021-04-04 ENCOUNTER — Ambulatory Visit
Admission: RE | Admit: 2021-04-04 | Discharge: 2021-04-04 | Disposition: A | Discharge status: Home / Self Care | Payer: Managed Care, Other (non HMO) | Source: Ambulatory Visit | Attending: Physician Assistant | Admitting: Physician Assistant

## 2021-04-04 ENCOUNTER — Other Ambulatory Visit: Payer: Self-pay

## 2021-04-04 DIAGNOSIS — Z1231 Encounter for screening mammogram for malignant neoplasm of breast: Secondary | ICD-10-CM

## 2021-09-01 NOTE — Progress Notes (Signed)
[name] [address] [place] [address] Phone: [phone] Subjective:   [name], am serving as a scribe for Dr. Antoine Primas. This visit occurred during the SARS-CoV-2 public health emergency.  Safety protocols were in place, including screening questions prior to the visit, additional usage of staff PPE, and extensive cleaning of exam room while observing appropriate contact time as indicated for disinfecting solutions.  I'm seeing this patient by the request  of:  Scifres, Dorothy, PA-C  CC: low back pain   [id number]  NEKAYLA HEIDER is a 46 y.o. female coming in with complaint of L sided LBP. Patient states that she has had pain for 13 years. Painful with movement especially flexion. Is   MRI lumbar 2019 IMPRESSION: 1. Stable compared to 2016. 2. L4-5 small noncompressive central disc protrusion.       Past Medical History:  Diagnosis Date   Chronic back pain    officially undiagnosed   No past surgical history on file. Social History   Socioeconomic History   Marital status: Unknown    Spouse name: Not on file   Number of children: Not on file   Years of education: Not on file   Highest education level: Not on file  Occupational History   Not on file  Tobacco Use   Smoking status: Never   Smokeless tobacco: Never  Substance and Sexual Activity   Alcohol use: No   Drug use: No   Sexual activity: Not on file  Other Topics Concern   Not on file  Social History Narrative   Not on file   Social Determinants of Health   Financial Resource Strain: Not on file  Food Insecurity: Not on file  Transportation Needs: Not on file  Physical Activity: Not on file  Stress: Not on file  Social Connections: Not on file   No Known Allergies No family history on file.  Current Outpatient Medications (Endocrine & Metabolic):    predniSONE (DELTASONE) 10 MG tablet, Take 2 tablets (20 mg total) by mouth daily.    predniSONE (STERAPRED UNI-PAK 21 TAB) 10 MG (21) TBPK tablet, Take as directed   predniSONE (STERAPRED UNI-PAK 21 TAB) 10 MG (21) TBPK tablet, Take as directed   predniSONE (STERAPRED UNI-PAK 21 TAB) 10 MG (21) TBPK tablet, Take as directed   SRONYX 0.1-20 MG-MCG tablet, Take 1 tablet by mouth daily.    Current Outpatient Medications (Analgesics):    HYDROcodone-acetaminophen (NORCO) 5-325 MG tablet, Take 1-2 tablets by mouth daily as needed.   HYDROcodone-acetaminophen (NORCO/VICODIN) 5-325 MG per tablet, Take 1 tablet by mouth once.   ibuprofen (ADVIL,MOTRIN) 200 MG tablet, Take 600 mg by mouth every 6 (six) hours as needed for headache, mild pain or moderate pain.   meloxicam (MOBIC) 7.5 MG tablet, Take 1 tablet (7.5 mg total) by mouth 2 (two) times daily.   oxyCODONE-acetaminophen (PERCOCET/ROXICET) 5-325 MG per tablet, Take 1-2 tablets by mouth every 6 (six) hours as needed for severe pain.   Current Outpatient Medications (Other):    cyclobenzaprine (FLEXERIL) 10 MG tablet, Take 10 mg by mouth 3 (three) times daily as needed for muscle spasms.    cyclobenzaprine (FLEXERIL) 5 MG tablet, Take 1-2 tablets (5-10 mg total) by mouth 3 (three) times daily as needed for muscle spasms.   diazepam (VALIUM) 5 MG tablet, Take 1 tablet (5 mg total) by mouth 2 (two) times daily.   gabapentin (NEURONTIN) 100 MG capsule, Take 2 capsules (  200 mg total) by mouth at bedtime.   methocarbamol (ROBAXIN) 500 MG tablet, Take 1 tablet (500 mg total) by mouth every 6 (six) hours as needed for muscle spasms.   methocarbamol (ROBAXIN) 500 MG tablet, TAKE 1 TABLET (500 MG TOTAL) BY MOUTH EVERY 6 (SIX) HOURS AS NEEDED FOR MUSCLE SPASMS.   Reviewed prior external information including notes and imaging from  primary care provider As well as notes that were available from care everywhere and other healthcare systems.  Past medical history, social, surgical and family history all reviewed in electronic medical  record.  No pertanent information unless stated regarding to the chief complaint.   Review of Systems:  No headache, visual changes, nausea, vomiting, diarrhea, constipation, dizziness, abdominal pain, skin rash, fevers, chills, night sweats, weight loss, swollen lymph nodes, body aches, joint swelling, chest pain, shortness of breath, mood changes. POSITIVE muscle aches  Objective  Blood pressure 110/68, pulse 81, height 5\' 2"  (1.575 m), weight 129 lb (58.5 kg), SpO2 99 %.   General: No apparent distress alert and oriented x3 mood and affect normal, dressed appropriately.  HEENT: Pupils equal, extraocular movements intact  Respiratory: Patient's speak in full sentences and does not appear short of breath  Cardiovascular: No lower extremity edema, non tender, no erythema  Gait normal with good balance and coordination.  MSK:   Low back exam shows patient does have some tenderness to palpation in the sacroiliac joint left greater than right.  Patient does have tightness noted of the left hip compared to the right.  Tightness of the hamstrings also noted.  Pain does seem to be somewhat out of proportion to the amount of palpation.  Osteopathic findings T9 extended rotated and side bent left L1 flexed rotated and side bent right Sacrum left on left  [id number]; 15 additional minutes spent for Therapeutic exercises as stated in above notes.  This included exercises focusing on stretching, strengthening, with significant focus on eccentric aspects.   Long term goals include an improvement in range of motion, strength, endurance as well as avoiding reinjury. Patient's frequency would include in 1-2 times a day, 3-5 times a week for a duration of 6-12 weeks. Low back exercises that included:  Pelvic tilt/bracing instruction to focus on control of the pelvic girdle and lower abdominal muscles  Glute strengthening exercises, focusing on proper firing of the glutes without engaging the low back  muscles Proper stretching techniques for maximum relief for the hamstrings, hip flexors, low back and some rotation where tolerated  Proper technique shown and discussed handout in great detail with ATC.  All questions were discussed and answered.     Impression and Recommendations:     The above documentation has been reviewed and is accurate and complete , DO

## 2021-09-02 ENCOUNTER — Encounter: Payer: Self-pay | Admitting: Family Medicine

## 2021-09-02 ENCOUNTER — Other Ambulatory Visit: Payer: Self-pay

## 2021-09-02 ENCOUNTER — Ambulatory Visit (INDEPENDENT_AMBULATORY_CARE_PROVIDER_SITE_OTHER): Payer: Managed Care, Other (non HMO) | Admitting: Family Medicine

## 2021-09-02 VITALS — BP 110/68 | HR 81 | Ht 62.0 in | Wt 129.0 lb

## 2021-09-02 DIAGNOSIS — M9904 Segmental and somatic dysfunction of sacral region: Secondary | ICD-10-CM | POA: Diagnosis not present

## 2021-09-02 DIAGNOSIS — M9903 Segmental and somatic dysfunction of lumbar region: Secondary | ICD-10-CM | POA: Diagnosis not present

## 2021-09-02 DIAGNOSIS — M9902 Segmental and somatic dysfunction of thoracic region: Secondary | ICD-10-CM | POA: Diagnosis not present

## 2021-09-02 DIAGNOSIS — M545 Low back pain, unspecified: Secondary | ICD-10-CM

## 2021-09-02 DIAGNOSIS — M533 Sacrococcygeal disorders, not elsewhere classified: Secondary | ICD-10-CM

## 2021-09-02 MED ORDER — KETOROLAC TROMETHAMINE 30 MG/ML IJ SOLN
30.0000 mg | Freq: Once | INTRAMUSCULAR | Status: AC
  Administered 2021-09-02: 30 mg via INTRAMUSCULAR

## 2021-09-02 MED ORDER — GABAPENTIN 100 MG PO CAPS: 200.0000 mg | ORAL_CAPSULE | Freq: Every day | ORAL | 0 refills | Status: DC

## 2021-09-02 MED ORDER — METHYLPREDNISOLONE ACETATE 40 MG/ML IJ SUSP
40.0000 mg | Freq: Once | INTRAMUSCULAR | Status: AC
  Administered 2021-09-02: 40 mg via INTRAMUSCULAR

## 2021-09-02 NOTE — Assessment & Plan Note (Signed)

## 2021-09-02 NOTE — Assessment & Plan Note (Signed)
Seems to be more of a chronic problem.  Does have the MRI showing an L4-L5 central disc protrusion that could be contributing to some of the discomfort.  Patient's pain seem to be more focal around the left sacroiliac joint.  Toradol and Depo-Medrol given today.  Responded extremely well to osteopathic manipulation today.  Discussed the low dose of gabapentin that I think will be helpful at night.  Follow-up with me again in 6 to 8 weeks

## 2021-09-02 NOTE — Patient Instructions (Signed)
Good to see you! Gabapentin 200mg  Vitamin D3 2000IU daily  Turmeric 500mg  daily   Do prescribed exercises at least 3x a week  Maybe take a break from PT See you again in 3-4 weeks

## 2021-09-22 NOTE — Progress Notes (Addendum)
[name] [place] [address] [place] Phone: [phone] Subjective:   IVilma [name], am serving as a scribe for Dr. Hulan Saas. This visit occurred during the SARS-CoV-2 public health emergency.  Safety protocols were in place, including screening questions prior to the visit, additional usage of staff PPE, and extensive cleaning of exam room while observing appropriate contact time as indicated for disinfecting solutions.   I'm seeing this patient by the request  of:  Scifres, Earlie Server, PA-C  CC: Low back pain  [id number]  09/02/2021 Seems to be more of a chronic problem.  Does have the MRI showing an L4-L5 central disc protrusion that could be contributing to some of the discomfort.  Patient's pain seem to be more focal around the left sacroiliac joint.  Toradol and Depo-Medrol given today.  Responded extremely well to osteopathic manipulation today.  Discussed the low dose of gabapentin that I think will be helpful at night.  Follow-up with me again in 6 to 8 weeks  Update 09/23/2021 Patty [name] is a 46 y.o. female coming in with complaint of L sided LBP. Patient states getting better. Less frequency and less intensity. Not really painful constantly. Only when aggravated. No new complaints.  Patient feels like is making improvement overall.  Did not feel that the osteopathic manipulation was helpful.     Past Medical History:  Diagnosis Date   Chronic back pain    officially undiagnosed   No past surgical history on file. Social History   Socioeconomic History   Marital status: Unknown    Spouse name: Not on file   Number of children: Not on file   Years of education: Not on file   Highest education level: Not on file  Occupational History   Not on file  Tobacco Use   Smoking status: Never   Smokeless tobacco: Never  Substance and Sexual Activity   Alcohol use: No   Drug use: No   Sexual activity: Not on file   Other Topics Concern   Not on file  Social History Narrative   Not on file   Social Determinants of Health   Financial Resource Strain: Not on file  Food Insecurity: Not on file  Transportation Needs: Not on file  Physical Activity: Not on file  Stress: Not on file  Social Connections: Not on file   No Known Allergies No family history on file.  Current Outpatient Medications (Endocrine & Metabolic):    predniSONE (DELTASONE) 10 MG tablet, Take 2 tablets (20 mg total) by mouth daily.   predniSONE (STERAPRED UNI-PAK 21 TAB) 10 MG (21) TBPK tablet, Take as directed   predniSONE (STERAPRED UNI-PAK 21 TAB) 10 MG (21) TBPK tablet, Take as directed   predniSONE (STERAPRED UNI-PAK 21 TAB) 10 MG (21) TBPK tablet, Take as directed   SRONYX 0.1-20 MG-MCG tablet, Take 1 tablet by mouth daily.    Current Outpatient Medications (Analgesics):    HYDROcodone-acetaminophen (NORCO) 5-325 MG tablet, Take 1-2 tablets by mouth daily as needed.   HYDROcodone-acetaminophen (NORCO/VICODIN) 5-325 MG per tablet, Take 1 tablet by mouth once.   ibuprofen (ADVIL,MOTRIN) 200 MG tablet, Take 600 mg by mouth every 6 (six) hours as needed for headache, mild pain or moderate pain.   meloxicam (MOBIC) 7.5 MG tablet, Take 1 tablet (7.5 mg total) by mouth 2 (two) times daily.   oxyCODONE-acetaminophen (PERCOCET/ROXICET) 5-325 MG per tablet, Take 1-2 tablets by mouth every 6 (six) hours as  needed for severe pain.   Current Outpatient Medications (Other):    cyclobenzaprine (FLEXERIL) 10 MG tablet, Take 10 mg by mouth 3 (three) times daily as needed for muscle spasms.    cyclobenzaprine (FLEXERIL) 5 MG tablet, Take 1-2 tablets (5-10 mg total) by mouth 3 (three) times daily as needed for muscle spasms.   diazepam (VALIUM) 5 MG tablet, Take 1 tablet (5 mg total) by mouth 2 (two) times daily.   gabapentin (NEURONTIN) 100 MG capsule, Take 2 capsules (200 mg total) by mouth at bedtime.   methocarbamol (ROBAXIN) 500 MG  tablet, Take 1 tablet (500 mg total) by mouth every 6 (six) hours as needed for muscle spasms.   methocarbamol (ROBAXIN) 500 MG tablet, TAKE 1 TABLET (500 MG TOTAL) BY MOUTH EVERY 6 (SIX) HOURS AS NEEDED FOR MUSCLE SPASMS.   Reviewed prior external information including notes and imaging from  primary care provider As well as notes that were available from care everywhere and other healthcare systems.  Past medical history, social, surgical and family history all reviewed in electronic medical record.  No pertanent information unless stated regarding to the chief complaint.   Review of Systems:  No headache, visual changes, nausea, vomiting, diarrhea, constipation, dizziness, abdominal pain, skin rash, fevers, chills, night sweats, weight loss, swollen lymph nodes, body aches, joint swelling, chest pain, shortness of breath, mood changes. POSITIVE muscle aches  Objective  Blood pressure 120/82, pulse 64, height 5\' 2"  (1.575 m), weight 129 lb (58.5 kg), SpO2 99 %.   General: No apparent distress alert and oriented x3 mood and affect normal, dressed appropriately.  HEENT: Pupils equal, extraocular movements intact  Respiratory: Patient's speak in full sentences and does not appear short of breath  Cardiovascular: No lower extremity edema, non tender, no erythema  Gait normal with good balance and coordination.  MSK: Hypermobility noted but continues to have discomfort noted over the paraspinal musculature of the lumbar spine as well as over the sacroiliac joint.  Seems to be left greater than right initially.  Osteopathic findings  T9 extended rotated and side bent left L5 flexed rotated and side bent right Sacrum left on left     Impression and Recommendations:     The above documentation has been reviewed and is accurate and complete Lyndal Pulley, DO

## 2021-09-23 ENCOUNTER — Ambulatory Visit (INDEPENDENT_AMBULATORY_CARE_PROVIDER_SITE_OTHER): Payer: Managed Care, Other (non HMO) | Admitting: Family Medicine

## 2021-09-23 ENCOUNTER — Encounter: Payer: Self-pay | Admitting: Family Medicine

## 2021-09-23 ENCOUNTER — Other Ambulatory Visit: Payer: Self-pay

## 2021-09-23 VITALS — BP 120/82 | HR 64 | Ht 62.0 in | Wt 129.0 lb

## 2021-09-23 DIAGNOSIS — M9902 Segmental and somatic dysfunction of thoracic region: Secondary | ICD-10-CM | POA: Diagnosis not present

## 2021-09-23 DIAGNOSIS — M533 Sacrococcygeal disorders, not elsewhere classified: Secondary | ICD-10-CM

## 2021-09-23 DIAGNOSIS — M9903 Segmental and somatic dysfunction of lumbar region: Secondary | ICD-10-CM

## 2021-09-23 DIAGNOSIS — M9904 Segmental and somatic dysfunction of sacral region: Secondary | ICD-10-CM | POA: Diagnosis not present

## 2021-09-23 NOTE — Patient Instructions (Signed)
Good to see you! We'll watch the right side Take 3 ibuprofen when you get home See you again in 5-6 weeks

## 2021-09-23 NOTE — Assessment & Plan Note (Signed)
Chronic, with mild exacerbation.  At the end of manipulation patient had more discomfort noted over the right sacroiliac joint than the left this time.  We discussed with patient icing regimen and home exercises.  Discussed core strengthening again.  Patient does have a the Robaxin if needed, given Flexeril previously and I do think the gabapentin is helpful with the tingling.  Worsening pain we have discussed alleviating further imaging.  Patient will follow-up with me though again in 6 weeks

## 2021-10-20 ENCOUNTER — Ambulatory Visit (INDEPENDENT_AMBULATORY_CARE_PROVIDER_SITE_OTHER): Payer: Managed Care, Other (non HMO) | Admitting: Sports Medicine

## 2021-10-20 ENCOUNTER — Other Ambulatory Visit: Payer: Self-pay

## 2021-10-20 VITALS — BP 118/80 | HR 71 | Ht 62.0 in | Wt 129.0 lb

## 2021-10-20 DIAGNOSIS — M533 Sacrococcygeal disorders, not elsewhere classified: Secondary | ICD-10-CM | POA: Diagnosis not present

## 2021-10-20 DIAGNOSIS — M9905 Segmental and somatic dysfunction of pelvic region: Secondary | ICD-10-CM

## 2021-10-20 DIAGNOSIS — M9904 Segmental and somatic dysfunction of sacral region: Secondary | ICD-10-CM

## 2021-10-20 DIAGNOSIS — M9903 Segmental and somatic dysfunction of lumbar region: Secondary | ICD-10-CM

## 2021-10-20 DIAGNOSIS — G8929 Other chronic pain: Secondary | ICD-10-CM

## 2021-10-20 DIAGNOSIS — M545 Low back pain, unspecified: Secondary | ICD-10-CM | POA: Diagnosis not present

## 2021-10-20 MED ORDER — MELOXICAM 15 MG PO TABS: 15.0000 mg | ORAL_TABLET | Freq: Every day | ORAL | 0 refills | Status: DC

## 2021-10-20 MED ORDER — METHOCARBAMOL 500 MG PO TABS: 500.0000 mg | ORAL_TABLET | Freq: Four times a day (QID) | ORAL | 0 refills | Status: DC

## 2021-10-20 NOTE — Patient Instructions (Addendum)
Good to see you  - Start meloxicam 15 mg daily x2 weeks.  If still having pain after 2 weeks, complete 3rd-week of meloxicam. May use remaining meloxicam as needed once daily for pain control.  Do not to use additional NSAIDs while taking meloxicam.  May use Tylenol [phone] mg to 3 times a day for breakthrough pain. Can continue robaxin as needed for muscle spasm  Can continue gabapentin  Low back HEP  As needed follow up if no better follow up 2-3 weeks

## 2021-10-20 NOTE — Progress Notes (Signed)
[facility] [name] [facility] [address] [place] [address] Phone: [phone]   Assessment and Plan:     1. SI (sacroiliac) joint dysfunction 2. Chronic bilateral low back pain without sciatica 3. Somatic dysfunction of lumbar region 4. Somatic dysfunction of pelvic region 5. Somatic dysfunction of sacral region - Chronic with exacerbation, subsequent sports medicine visit - Acute flare of low back pain, primarily localized around left SI joint - No red flag symptoms or significant MOI, so no imaging obtained at today's visit - Consistent with SI joint dysfunction - Start meloxicam 15 mg daily x2 weeks.  If still having pain after 2 weeks, complete 3rd-week of meloxicam. May use remaining meloxicam as needed once daily for pain control.  Do not to use additional NSAIDs while taking meloxicam.  May use Tylenol [phone] mg to 3 times a day for breakthrough pain. - Can continue Robaxin as needed for muscle spasms.  Refill provided today - Can continue gabapentin - Start HEP for low back stretching   Pertinent previous records reviewed include none   Follow Up: As needed if no improvement or worsening of symptoms in 2 to 3 weeks.  Could obtain imaging at that time.  Could repeat OMT if necessary   Subjective:   I, Moenique Parris, am serving as a Neurosurgeon for Doctor [employer]  Chief Complaint: low back pain   HPI:  09/02/2021 Seems to be more of a chronic problem.  Does have the MRI showing an L4-L5 central disc protrusion that could be contributing to some of the discomfort.  Patient's pain seem to be more focal around the left sacroiliac joint.  Toradol and Depo-Medrol given today.  Responded extremely well to osteopathic manipulation today.  Discussed the low dose of gabapentin that I think will be helpful at night.  Follow-up with me again in 6 to 8 weeks   Update 09/23/2021 Patty [name] is a 46 y.o. female coming in with  complaint of L sided LBP. Patient states getting better. Less frequency and less intensity. Not really painful constantly. Only when aggravated. No new complaints.  Patient feels like is making improvement overall.  Did not feel that the osteopathic manipulation was helpful.  10/20/21 Patient states that she triggered her back pain yesterday at work , she was lifting and sharp pain on her lower left back , last week she did feel numbness and coolness in her lower spine area  Relevant Historical Information: None pertinent  Additional pertinent review of systems negative.   Current Outpatient Medications:    cyclobenzaprine (FLEXERIL) 10 MG tablet, Take 10 mg by mouth 3 (three) times daily as needed for muscle spasms. , Disp: , Rfl:    cyclobenzaprine (FLEXERIL) 5 MG tablet, Take 1-2 tablets (5-10 mg total) by mouth 3 (three) times daily as needed for muscle spasms., Disp: 10 tablet, Rfl: 3   diazepam (VALIUM) 5 MG tablet, Take 1 tablet (5 mg total) by mouth 2 (two) times daily., Disp: 15 tablet, Rfl: 0   gabapentin (NEURONTIN) 100 MG capsule, Take 2 capsules (200 mg total) by mouth at bedtime., Disp: 120 capsule, Rfl: 0   HYDROcodone-acetaminophen (NORCO) 5-325 MG tablet, Take 1-2 tablets by mouth daily as needed., Disp: 10 tablet, Rfl: 0   HYDROcodone-acetaminophen (NORCO/VICODIN) 5-325 MG per tablet, Take 1 tablet by mouth once., Disp: , Rfl:    ibuprofen (ADVIL,MOTRIN) 200 MG tablet, Take 600 mg by mouth every 6 (six) hours as needed for  headache, mild pain or moderate pain., Disp: , Rfl:    meloxicam (MOBIC) 15 MG tablet, Take 1 tablet (15 mg total) by mouth daily., Disp: 30 tablet, Rfl: 0   meloxicam (MOBIC) 7.5 MG tablet, Take 1 tablet (7.5 mg total) by mouth 2 (two) times daily., Disp: 30 tablet, Rfl: 0   methocarbamol (ROBAXIN) 500 MG tablet, Take 1 tablet (500 mg total) by mouth every 6 (six) hours as needed for muscle spasms., Disp: 30 tablet, Rfl: 2   methocarbamol (ROBAXIN) 500 MG  tablet, TAKE 1 TABLET (500 MG TOTAL) BY MOUTH EVERY 6 (SIX) HOURS AS NEEDED FOR MUSCLE SPASMS., Disp: 10 tablet, Rfl: 2   methocarbamol (ROBAXIN) 500 MG tablet, Take 1 tablet (500 mg total) by mouth 4 (four) times daily., Disp: 30 tablet, Rfl: 0   oxyCODONE-acetaminophen (PERCOCET/ROXICET) 5-325 MG per tablet, Take 1-2 tablets by mouth every 6 (six) hours as needed for severe pain., Disp: 25 tablet, Rfl: 0   predniSONE (DELTASONE) 10 MG tablet, Take 2 tablets (20 mg total) by mouth daily., Disp: 21 tablet, Rfl: 0   predniSONE (STERAPRED UNI-PAK 21 TAB) 10 MG (21) TBPK tablet, Take as directed, Disp: 21 tablet, Rfl: 0   predniSONE (STERAPRED UNI-PAK 21 TAB) 10 MG (21) TBPK tablet, Take as directed, Disp: 21 tablet, Rfl: 0   predniSONE (STERAPRED UNI-PAK 21 TAB) 10 MG (21) TBPK tablet, Take as directed, Disp: 21 tablet, Rfl: 0   SRONYX 0.1-20 MG-MCG tablet, Take 1 tablet by mouth daily., Disp: , Rfl: 6   Objective:     Vitals:   10/20/21 1352  BP: 118/80  Pulse: 71  SpO2: 99%  Weight: 129 lb (58.5 kg)  Height: 5\' 2"  (1.575 m)      Body mass index is 23.59 kg/m.    Physical Exam:    Gen: Appears well, nad, nontoxic and pleasant Psych: Alert and oriented, appropriate mood and affect Neuro: sensation intact, strength is 5/5 in upper and lower extremities, muscle tone wnl Skin: no susupicious lesions or rashes  Back - Normal skin, Spine with normal alignment and no deformity.   No tenderness to vertebral process palpation.   Paraspinous muscles are not tender and without spasm Straight leg raise negative Piriformis test negative bilaterally  General: Well-appearing, cooperative, sitting comfortably in no acute distress.   OMT Physical Exam:  ASIS Compression Test: Positive left Sacrum: Positive sphinx, TTP bilateral sacral base Lumbar: TTP left paraspInal, L1-3 RRSL, L5 RL Pelvis: Left posterior innominate     Electronically signed by:  [name] 3:12 PM 10/20/21

## 2021-10-30 NOTE — Progress Notes (Signed)
°  [name] [address] [place] [address] Phone: [phone] Subjective:   [name], am serving as a scribe for Dr. Antoine Primas.  This visit occurred during the SARS-CoV-2 public health emergency.  Safety protocols were in place, including screening questions prior to the visit, additional usage of staff PPE, and extensive cleaning of exam room while observing appropriate contact time as indicated for disinfecting solutions.    I'm seeing this patient by the request  of:  Scifres, Dorothy, PA-C  CC: Back and neck pain follow-up  [id number]  Patty [name] is a 46 y.o. female coming in with complaint of back and neck pain. OMT 10/20/2021. Patient states that she had a flare 10 days ago. Patient has been doing Meloxicam for past 2 weeks and did use mm relaxer week 1. Pain is in lower L lumbar spine. Pain will be numb and/or shooting with flexion and rotation.   Medications patient has been prescribed: Gabapentin  Taking:         Reviewed prior external information including notes and imaging from previsou exam, outside providers and external EMR if available.   As well as notes that were available from care everywhere and other healthcare systems.  Past medical history, social, surgical and family history all reviewed in electronic medical record.  No pertanent information unless stated regarding to the chief complaint.   Past Medical History:  Diagnosis Date   Chronic back pain    officially undiagnosed    No Known Allergies   Review of Systems:  No headache, visual changes, nausea, vomiting, diarrhea, constipation, dizziness, abdominal pain, skin rash, fevers, chills, night sweats, weight loss, swollen lymph nodes, body aches, joint swelling, chest pain, shortness of breath, mood changes. POSITIVE muscle aches  Objective  Blood pressure (!) 118/92, pulse 67, height 5\' 2"  (1.575 m), weight 127 lb (57.6 kg), SpO2 99  %.   General: No apparent distress alert and oriented x3 mood and affect normal, dressed appropriately.  Tearful and seems to be also a little stressed at baseline HEENT: Pupils equal, extraocular movements intact  Respiratory: Patient's speak in full sentences and does not appear short of breath  Patient back continues to have discomfort and pain.  The patient does have a significant tightness noted of the lumbar spine.     Assessment and Plan:       The above documentation has been reviewed and is accurate and complete , DO        Note: This dictation was prepared with Dragon dictation along with smaller phrase technology. Any transcriptional errors that result from this process are unintentional.

## 2021-10-31 ENCOUNTER — Ambulatory Visit (INDEPENDENT_AMBULATORY_CARE_PROVIDER_SITE_OTHER): Payer: Managed Care, Other (non HMO) | Admitting: Family Medicine

## 2021-10-31 ENCOUNTER — Other Ambulatory Visit: Payer: Self-pay

## 2021-10-31 ENCOUNTER — Encounter: Payer: Self-pay | Admitting: Family Medicine

## 2021-10-31 VITALS — BP 118/92 | HR 67 | Ht 62.0 in | Wt 127.0 lb

## 2021-10-31 DIAGNOSIS — M2550 Pain in unspecified joint: Secondary | ICD-10-CM | POA: Diagnosis not present

## 2021-10-31 DIAGNOSIS — M533 Sacrococcygeal disorders, not elsewhere classified: Secondary | ICD-10-CM | POA: Diagnosis not present

## 2021-10-31 LAB — CBC WITH DIFFERENTIAL/PLATELET
Basophils Absolute: 0.1 K/uL (ref 0.0–0.1)
Basophils Relative: 0.9 % (ref 0.0–3.0)
Eosinophils Absolute: 0.1 K/uL (ref 0.0–0.7)
Eosinophils Relative: 1.2 % (ref 0.0–5.0)
HCT: 43.1 % (ref 36.0–46.0)
Hemoglobin: 14.5 g/dL (ref 12.0–15.0)
Lymphocytes Relative: 17.9 % (ref 12.0–46.0)
Lymphs Abs: 1.2 K/uL (ref 0.7–4.0)
MCHC: 33.5 g/dL (ref 30.0–36.0)
MCV: 91.8 fl (ref 78.0–100.0)
Monocytes Absolute: 0.4 K/uL (ref 0.1–1.0)
Monocytes Relative: 5.8 % (ref 3.0–12.0)
Neutro Abs: 4.8 K/uL (ref 1.4–7.7)
Neutrophils Relative %: 74.2 % (ref 43.0–77.0)
Platelets: 239 K/uL (ref 150.0–400.0)
RBC: 4.7 Mil/uL (ref 3.87–5.11)
RDW: 12.3 % (ref 11.5–15.5)
WBC: 6.5 K/uL (ref 4.0–10.5)

## 2021-10-31 LAB — COMPREHENSIVE METABOLIC PANEL WITH GFR
ALT: 10 U/L (ref 0–35)
AST: 17 U/L (ref 0–37)
Albumin: 4.8 g/dL (ref 3.5–5.2)
Alkaline Phosphatase: 33 U/L — ABNORMAL LOW (ref 39–117)
BUN: 15 mg/dL (ref 6–23)
CO2: 27 meq/L (ref 19–32)
Calcium: 9.5 mg/dL (ref 8.4–10.5)
Chloride: 103 meq/L (ref 96–112)
Creatinine, Ser: 0.81 mg/dL (ref 0.40–1.20)
GFR: 88.84 mL/min
Glucose, Bld: 89 mg/dL (ref 70–99)
Potassium: 3.8 meq/L (ref 3.5–5.1)
Sodium: 138 meq/L (ref 135–145)
Total Bilirubin: 2.1 mg/dL — ABNORMAL HIGH (ref 0.2–1.2)
Total Protein: 7.2 g/dL (ref 6.0–8.3)

## 2021-10-31 LAB — C-REACTIVE PROTEIN: CRP: <1 mg/dL (ref 0.5–20.0)

## 2021-10-31 LAB — FERRITIN: Ferritin: 51.8 ng/mL (ref 10.0–291.0)

## 2021-10-31 LAB — IBC PANEL
Iron: 183 ug/dL — ABNORMAL HIGH (ref 42–145)
Saturation Ratios: 45.1 % (ref 20.0–50.0)
TIBC: 406 ug/dL (ref 250.0–450.0)
Transferrin: 290 mg/dL (ref 212.0–360.0)

## 2021-10-31 LAB — SEDIMENTATION RATE: Sed Rate: 4 mm/hr (ref 0–20)

## 2021-10-31 LAB — TSH: TSH: 0.54 u[IU]/mL (ref 0.35–5.50)

## 2021-10-31 LAB — URIC ACID: Uric Acid, Serum: 3 mg/dL (ref 2.4–7.0)

## 2021-10-31 LAB — VITAMIN D 25 HYDROXY (VIT D DEFICIENCY, FRACTURES): VITD: 49.48 ng/mL (ref 30.00–100.00)

## 2021-10-31 MED ORDER — METHYLPREDNISOLONE ACETATE 80 MG/ML IJ SUSP
80.0000 mg | Freq: Once | INTRAMUSCULAR | Status: AC
  Administered 2021-10-31: 80 mg via INTRAMUSCULAR

## 2021-10-31 MED ORDER — KETOROLAC TROMETHAMINE 60 MG/2ML IM SOLN
60.0000 mg | Freq: Once | INTRAMUSCULAR | Status: AC
  Administered 2021-10-31: 60 mg via INTRAMUSCULAR

## 2021-10-31 NOTE — Patient Instructions (Addendum)
?  Labs today ?Do not take any NSAIDs after cocktail ?See me again in 4 weeks ?

## 2021-10-31 NOTE — Assessment & Plan Note (Signed)
Patient is quite frustrated at the moment. ?He remains tearful on exam today.  The patient states that any of the different treatment options have not been significantly beneficial at this time.  Has been given gabapentin, Flexeril, given Toradol and Depo-Medrol today to help with some of the pain which is the only thing that patient states that did help but also was only temporary.  Patient has had numerous MRIs that did not show anything significant.  One was in 2016 and 1 in 2019 that were also independently visualized by me today.  I do feel at this point that there could be something else that could be contributing to the patient's pain and we will get laboratory work-up to see if this is true.  Depending on findings we will discuss with patient and make other changes.  Patient will have a follow-up with [place] again in 4 weeks and we can consider restarting osteopathic manipulation ?

## 2021-11-03 ENCOUNTER — Other Ambulatory Visit: Payer: Self-pay | Admitting: Family Medicine

## 2021-11-03 LAB — ANGIOTENSIN CONVERTING ENZYME: Angiotensin-Converting Enzyme: 21 U/L (ref 9–67)

## 2021-11-03 LAB — RHEUMATOID FACTOR: Rheumatoid fact SerPl-aCnc: <14 IU/mL (ref <14)

## 2021-11-03 LAB — CYCLIC CITRUL PEPTIDE ANTIBODY, IGG: Cyclic Citrullin Peptide Ab: <16 UNITS

## 2021-11-03 LAB — ANA: Anti Nuclear Antibody (ANA): NEGATIVE

## 2021-11-03 LAB — CALCIUM, IONIZED: Calcium, Ion: 5 mg/dL (ref 4.8–5.6)

## 2021-11-03 LAB — PTH, INTACT AND CALCIUM
Calcium: 9.6 mg/dL (ref 8.6–10.2)
PTH: 25 pg/mL (ref 16–77)

## 2021-11-16 ENCOUNTER — Other Ambulatory Visit: Payer: Self-pay | Admitting: Sports Medicine

## 2021-11-26 NOTE — Progress Notes (Signed)
?[name] D.O. ?[facility] ?[address] [place] [address] ?Phone: [phone] ?Subjective:   ?I, Wilford Grist, am serving as a scribe for Dr. Antoine Primas. ? ?This visit occurred during the SARS-CoV-2 public health emergency.  Safety protocols were in place, including screening questions prior to the visit, additional usage of staff PPE, and extensive cleaning of exam room while observing appropriate contact time as indicated for disinfecting solutions.  ?I'm seeing this patient by the request  of:  Scifres, Dorothy, PA-C ? ?CC: Low back pain follow-up ? ?[id number]  ?10/31/2021 ?Patient is quite frustrated at the moment. ?He remains tearful on exam today.  The patient states that any of the different treatment options have not been significantly beneficial at this time.  Has been given gabapentin, Flexeril, given Toradol and Depo-Medrol today to help with some of the pain which is the only thing that patient states that did help but also was only temporary.  Patient has had numerous MRIs that did not show anything significant.  One was in 2016 and 1 in 2019 that were also independently visualized by me today.  I do feel at this point that there could be something else that could be contributing to the patient's pain and we will get laboratory work-up to see if this is true.  Depending on findings we will discuss with patient and make other changes.  Patient will have a follow-up with [place] again in 4 weeks and we can consider restarting osteopathic manipulation ? ?Update 11/27/2021 ?Patty [name] is a 46 y.o. female coming in with complaint of SI joint pain. Patient states that she is doing better. Continues to have good and bad days.  Patient states though having more good than bad days.  States that she has a 4 out of 10 pain most of the time.  Nothing that is stopping her from activity at the moment. ? ?  ? ?Past Medical History:  ?Diagnosis Date  ? Chronic back pain   ? officially  undiagnosed  ? ?No past surgical history on file. ?Social History  ? ?Socioeconomic History  ? Marital status: Unknown  ?  Spouse name: Not on file  ? Number of children: Not on file  ? Years of education: Not on file  ? Highest education level: Not on file  ?Occupational History  ? Not on file  ?Tobacco Use  ? Smoking status: Never  ? Smokeless tobacco: Never  ?Substance and Sexual Activity  ? Alcohol use: No  ? Drug use: No  ? Sexual activity: Not on file  ?Other Topics Concern  ? Not on file  ?Social History Narrative  ? Not on file  ? ?Social Determinants of Health  ? ?Financial Resource Strain: Not on file  ?Food Insecurity: Not on file  ?Transportation Needs: Not on file  ?Physical Activity: Not on file  ?Stress: Not on file  ?Social Connections: Not on file  ? ?No Known Allergies ?No family history on file. ? ?Current Outpatient Medications (Endocrine & Metabolic):  ?  predniSONE (DELTASONE) 10 MG tablet, Take 2 tablets (20 mg total) by mouth daily. ?  predniSONE (DELTASONE) 20 MG tablet, Take 2 tablets (40 mg total) by mouth daily with breakfast. ?  predniSONE (STERAPRED UNI-PAK 21 TAB) 10 MG (21) TBPK tablet, Take as directed ?  predniSONE (STERAPRED UNI-PAK 21 TAB) 10 MG (21) TBPK tablet, Take as directed ?  predniSONE (STERAPRED UNI-PAK 21 TAB) 10 MG (21) TBPK tablet, Take as directed ?  SRONYX 0.1-20 MG-MCG tablet, Take 1 tablet by mouth daily. ? ? ? ?Current Outpatient Medications (Analgesics):  ?  HYDROcodone-acetaminophen (NORCO) 5-325 MG tablet, Take 1-2 tablets by mouth daily as needed. ?  HYDROcodone-acetaminophen (NORCO/VICODIN) 5-325 MG per tablet, Take 1 tablet by mouth once. ?  ibuprofen (ADVIL,MOTRIN) 200 MG tablet, Take 600 mg by mouth every 6 (six) hours as needed for headache, mild pain or moderate pain. ?  meloxicam (MOBIC) 15 MG tablet, Take 1 tablet (15 mg total) by mouth daily. ?  meloxicam (MOBIC) 7.5 MG tablet, Take 1 tablet (7.5 mg total) by mouth 2 (two) times daily. ?   oxyCODONE-acetaminophen (PERCOCET/ROXICET) 5-325 MG per tablet, Take 1-2 tablets by mouth every 6 (six) hours as needed for severe pain. ? ? ?Current Outpatient Medications (Other):  ?  cyclobenzaprine (FLEXERIL) 10 MG tablet, Take 10 mg by mouth 3 (three) times daily as needed for muscle spasms.  ?  cyclobenzaprine (FLEXERIL) 5 MG tablet, Take 1-2 tablets (5-10 mg total) by mouth 3 (three) times daily as needed for muscle spasms. ?  diazepam (VALIUM) 5 MG tablet, Take 1 tablet (5 mg total) by mouth 2 (two) times daily. ?  gabapentin (NEURONTIN) 100 MG capsule, TAKE 2 CAPSULES BY MOUTH AT BEDTIME. ?  methocarbamol (ROBAXIN) 500 MG tablet, Take 1 tablet (500 mg total) by mouth every 6 (six) hours as needed for muscle spasms. ?  methocarbamol (ROBAXIN) 500 MG tablet, TAKE 1 TABLET (500 MG TOTAL) BY MOUTH EVERY 6 (SIX) HOURS AS NEEDED FOR MUSCLE SPASMS. ?  methocarbamol (ROBAXIN) 500 MG tablet, Take 1 tablet (500 mg total) by mouth 4 (four) times daily. ? ? ? ?Past medical history, social, surgical and family history all reviewed in electronic medical record.  No pertanent information unless stated regarding to the chief complaint.  ? ?Review of Systems: ? No headache, visual changes, nausea, vomiting, diarrhea, constipation, dizziness, abdominal pain, skin rash, fevers, chills, night sweats, weight loss, swollen lymph nodes,  joint swelling, chest pain, shortness of breath, mood changes. POSITIVE muscle aches, body aches ? ?Objective  ?Blood pressure 110/86, pulse 88, height 5\' 2"  (1.575 m), weight 127 lb (57.6 kg), SpO2 98 %. ?  ?General: No apparent distress alert and oriented x3 mood and affect normal, dressed appropriately.  ?HEENT: Pupils equal, extraocular movements intact  ?Respiratory: Patient's speak in full sentences and does not appear short of breath  ?Cardiovascular: No lower extremity edema, non tender, no erythema  ?Gait normal with good balance and coordination.  ?MSK:  Non tender with full range of  motion and good stability and symmetric strength and tone of shoulders, elbows, wrist, hip, knee and ankles bilaterally.  ?Low back exam mild tenderness mostly over the left sacroiliac joint.  Tightness with left compared to the contralateral side.  Negative straight leg test bilaterally.  Patient has actually made some improvement in hip abductor strength since previous exam ?  ?Impression and Recommendations:  ?  ? ?The above documentation has been reviewed and is accurate and complete Pearlean Brownie, DO ? ? ? ?

## 2021-11-27 ENCOUNTER — Ambulatory Visit (INDEPENDENT_AMBULATORY_CARE_PROVIDER_SITE_OTHER): Payer: Managed Care, Other (non HMO) | Admitting: Family Medicine

## 2021-11-27 DIAGNOSIS — M533 Sacrococcygeal disorders, not elsewhere classified: Secondary | ICD-10-CM | POA: Diagnosis not present

## 2021-11-27 MED ORDER — PREDNISONE 20 MG PO TABS: 40.0000 mg | ORAL_TABLET | Freq: Every day | ORAL | 0 refills | Status: DC

## 2021-11-27 NOTE — Patient Instructions (Addendum)
Good to see you! ?So glad you're doing better ?Prednisone 40 5 days ?Keep being active ?We have cocktail when you need it ?See you again in 2-3 months ?

## 2021-11-27 NOTE — Assessment & Plan Note (Signed)
Still believe the patient's pain is more secondary to the sacroiliac joint at this time.  We discussed the possibility again of possible MRIs and doing more of an MRI of the pelvis with and without contrast.  Patient would like to hold at this time though.  Patient has not taken any medications at this moment.  We discussed with patient that if any worsening we would consider repeating some of the other things that helped including the Toradol and Depo-Medrol injections as well as given a course of prednisone to have on hand especially if patient is traveling.  In addition to this we discussed that we could try osteopathic manipulation but with patient doing so well we will continue to monitor.  Follow-up with me again 2 months ?

## 2022-01-18 ENCOUNTER — Other Ambulatory Visit: Payer: Self-pay | Admitting: Family Medicine

## 2022-01-21 NOTE — Progress Notes (Unsigned)
[facility] [place] Phone: [phone] Subjective:   Patty [name], am serving as a scribe for Dr. Hulan Saas.   I'm seeing this patient by the request  of:  Scifres, Dorothy, PA-C (Inactive)  CC: Low back and SI joint pain  [id number]  11/27/2021 Still believe the patient's pain is more secondary to the sacroiliac joint at this time.  We discussed the possibility again of possible MRIs and doing more of an MRI of the pelvis with and without contrast.  Patient would like to hold at this time though.  Patient has not taken any medications at this moment.  We discussed with patient that if any worsening we would consider repeating some of the other things that helped including the Toradol and Depo-Medrol injections as well as given a course of prednisone to have on hand especially if patient is traveling.  In addition to this we discussed that we could try osteopathic manipulation but with patient doing so well we will continue to monitor.  Follow-up with me again 2 months  Update 01/23/2022 Patty [name] is a 46 y.o. female coming in with complaint of SI joint dysfunction. Patient states that she has had some good and bad days. Using IBU daily. Trying to strength train which has helped.  Patient states that unfortunately continues to have pain on a daily basis.  Does need to take anti-inflammatories regularly well.      Past Medical History:  Diagnosis Date   Chronic back pain    officially undiagnosed   [name] past surgical history on file. Social History   Socioeconomic History   Marital status: Unknown    Spouse name: Not on file   Number of children: Not on file   Years of education: Not on file   Highest education level: Not on file  Occupational History   Not on file  Tobacco Use   Smoking status: Never   Smokeless tobacco: Never  Substance and Sexual Activity   Alcohol use: [name]   Drug use: [name]   Sexual  activity: Not on file  Other Topics Concern   Not on file  Social History Narrative   Not on file   Social Determinants of Health   Financial Resource Strain: Not on file  Food Insecurity: Not on file  Transportation Needs: Not on file  Physical Activity: Not on file  Stress: Not on file  Social Connections: Not on file   [name] Known Allergies [name] family history on file.  Current Outpatient Medications (Endocrine & Metabolic):    predniSONE (DELTASONE) 10 MG tablet, Take 2 tablets (20 mg total) by mouth daily.   predniSONE (DELTASONE) 20 MG tablet, Take 2 tablets (40 mg total) by mouth daily with breakfast.   predniSONE (STERAPRED UNI-PAK 21 TAB) 10 MG (21) TBPK tablet, Take as directed   predniSONE (STERAPRED UNI-PAK 21 TAB) 10 MG (21) TBPK tablet, Take as directed   predniSONE (STERAPRED UNI-PAK 21 TAB) 10 MG (21) TBPK tablet, Take as directed   SRONYX 0.1-20 MG-MCG tablet, Take 1 tablet by mouth daily.    Current Outpatient Medications (Analgesics):    HYDROcodone-acetaminophen (NORCO) 5-325 MG tablet, Take 1-2 tablets by mouth daily as needed.   HYDROcodone-acetaminophen (NORCO/VICODIN) 5-325 MG per tablet, Take 1 tablet by mouth once.   ibuprofen (ADVIL,MOTRIN) 200 MG tablet, Take 600 mg by mouth every 6 (six) hours as needed for headache, mild pain or moderate pain.  meloxicam (MOBIC) 15 MG tablet, Take 1 tablet (15 mg total) by mouth daily.   meloxicam (MOBIC) 7.5 MG tablet, Take 1 tablet (7.5 mg total) by mouth 2 (two) times daily.   oxyCODONE-acetaminophen (PERCOCET/ROXICET) 5-325 MG per tablet, Take 1-2 tablets by mouth every 6 (six) hours as needed for severe pain.   Current Outpatient Medications (Other):    cyclobenzaprine (FLEXERIL) 10 MG tablet, Take 10 mg by mouth 3 (three) times daily as needed for muscle spasms.    cyclobenzaprine (FLEXERIL) 5 MG tablet, Take 1-2 tablets (5-10 mg total) by mouth 3 (three) times daily as needed for muscle spasms.   diazepam  (VALIUM) 5 MG tablet, Take 1 tablet (5 mg total) by mouth 2 (two) times daily.   gabapentin (NEURONTIN) 100 MG capsule, TAKE 2 CAPSULES BY MOUTH AT BEDTIME   methocarbamol (ROBAXIN) 500 MG tablet, Take 1 tablet (500 mg total) by mouth every 6 (six) hours as needed for muscle spasms.   methocarbamol (ROBAXIN) 500 MG tablet, TAKE 1 TABLET (500 MG TOTAL) BY MOUTH EVERY 6 (SIX) HOURS AS NEEDED FOR MUSCLE SPASMS.   methocarbamol (ROBAXIN) 500 MG tablet, Take 1 tablet (500 mg total) by mouth 4 (four) times daily.   Reviewed prior external information including notes and imaging from  primary care provider As well as notes that were available from care everywhere and other healthcare systems.  Past medical history, social, surgical and family history all reviewed in electronic medical record.  [name] pertanent information unless stated regarding to the chief complaint.   Review of Systems:  [name] headache, visual changes, nausea, vomiting, diarrhea, constipation, dizziness, abdominal pain, skin rash, fevers, chills, night sweats, weight loss, swollen lymph nodes, body aches, joint swelling, chest pain, shortness of breath, mood changes. POSITIVE muscle aches  Objective  Blood pressure 108/80, pulse 62, height 5\' 2"  (1.575 m), weight 129 lb (58.5 kg), last menstrual period 01/12/2022, SpO2 98 %.   General: [name] apparent distress alert and oriented x3 mood and affect normal, dressed appropriately.  HEENT: Pupils equal, extraocular movements intact  Respiratory: Patient's speak in full sentences and does not appear short of breath  Cardiovascular: [name] lower extremity edema, non tender, [name] erythema  Gait normal with good balance and coordination.  MSK: Low back exam does have fullness noted.  Tender to palpation over the sacroiliac joint.  Extensive fullness noted over this area mostly right greater than left.  Patient does have tightness noted more of the sacroiliac joints bilaterally.  Does have an anterior  ilium noted.  Osteopathic findings  T3 extended rotated and side bent right inhaled third rib T9 extended rotated and side bent left L2 flexed rotated and side bent right Sacrum right on right Left anterior ilium   Impression and Recommendations:    The above documentation has been reviewed and is accurate and complete Lyndal Pulley, DO

## 2022-01-23 ENCOUNTER — Encounter: Payer: Self-pay | Admitting: Family Medicine

## 2022-01-23 ENCOUNTER — Ambulatory Visit (INDEPENDENT_AMBULATORY_CARE_PROVIDER_SITE_OTHER): Payer: Managed Care, Other (non HMO) | Admitting: Family Medicine

## 2022-01-23 ENCOUNTER — Ambulatory Visit (INDEPENDENT_AMBULATORY_CARE_PROVIDER_SITE_OTHER): Payer: Managed Care, Other (non HMO)

## 2022-01-23 VITALS — BP 108/80 | HR 62 | Ht 62.0 in | Wt 129.0 lb

## 2022-01-23 DIAGNOSIS — M545 Low back pain, unspecified: Secondary | ICD-10-CM

## 2022-01-23 DIAGNOSIS — M9903 Segmental and somatic dysfunction of lumbar region: Secondary | ICD-10-CM | POA: Diagnosis not present

## 2022-01-23 DIAGNOSIS — M533 Sacrococcygeal disorders, not elsewhere classified: Secondary | ICD-10-CM

## 2022-01-23 DIAGNOSIS — M9904 Segmental and somatic dysfunction of sacral region: Secondary | ICD-10-CM

## 2022-01-23 DIAGNOSIS — M9902 Segmental and somatic dysfunction of thoracic region: Secondary | ICD-10-CM | POA: Diagnosis not present

## 2022-01-23 NOTE — Assessment & Plan Note (Addendum)
Chronic noted with mild inflammation patient is still having pain mostly over the sacroiliac joint.  There may be some palpable swelling in the area.  We will get x-rays to further evaluate.  If continues to have pain out of proportion we will need to consider the possibility of advanced imaging.  Attempted osteopathic manipulation again and we will see how patient responds.  Follow-up again in 6 to 8 weeks meloxicam and methocarbamol discussed

## 2022-01-23 NOTE — Patient Instructions (Addendum)
Good to see you Xray today See me again in 6 weeks 3 IBU 2x a day for the weekend

## 2022-01-23 NOTE — Assessment & Plan Note (Signed)

## 2022-03-05 NOTE — Progress Notes (Signed)
[name] [address] [place] [address] Phone: [phone] Subjective:   [name], am serving as a scribe for Dr. Antoine Primas. 128lb I'm seeing this patient by the request  of:  Scifres, Dorothy, PA-C (Inactive)  CC: Neck and back pain follow-up  [id number]  JKAYLA SPIEWAK is a 46 y.o. female coming in with complaint of back and neck pain. OMT 01/23/2022. Patient states that she has been doing well since last visit. Little pain in back and neck.   Medications patient has been prescribed: Gabapentin  Taking:         Reviewed prior external information including notes and imaging from previsou exam, outside providers and external EMR if available.   As well as notes that were available from care everywhere and other healthcare systems.  Past medical history, social, surgical and family history all reviewed in electronic medical record.  No pertanent information unless stated regarding to the chief complaint.   Past Medical History:  Diagnosis Date   Chronic back pain    officially undiagnosed    No Known Allergies   Review of Systems:  No headache, visual changes, nausea, vomiting, diarrhea, constipation, dizziness, abdominal pain, skin rash, fevers, chills, night sweats, weight loss, swollen lymph nodes, body aches, joint swelling, chest pain, shortness of breath, mood changes. POSITIVE muscle aches  Objective  Blood pressure 112/84, pulse 84, height 5\' 2"  (1.575 m), SpO2 99 %.   General: No apparent distress alert and oriented x3 mood and affect normal, dressed appropriately.  HEENT: Pupils equal, extraocular movements intact  Respiratory: Patient's speak in full sentences and does not appear short of breath  Cardiovascular: No lower extremity edema, non tender, no erythema  Gait normal gait MSK:  Back does have some loss of lordosis.  Still has some tightness around the sacroiliac joint.  Seems to be a little  bit more left greater than right at the moment.  Usually does have more discomfort on the right.  Patient does have tenderness in the left parascapular region as well.  Some limited sidebending of the neck bilaterally.  Osteopathic findings  C2 flexed rotated and side bent right C4 flexed rotated and side bent left T6 extended rotated and side bent right inhaled rib T9 extended rotated and side bent left L2 flexed rotated and side bent right Sacrum left on left       Assessment and Plan:  SI (sacroiliac) joint dysfunction Continues to make some progress overall.  Still has some tingling from time to time but has made improvement.  Continue to work on the hip abductor strengthening.  Refilled patient's muscle relaxer for her trip.  Discussed icing regimen and home exercises otherwise follow-up again in 6 to 8 weeks    Nonallopathic problems  Decision today to treat with OMT was based on Physical Exam  After verbal consent patient was treated with HVLA, ME, FPR techniques in cervical, rib, thoracic, lumbar, and sacral  areas  Patient tolerated the procedure well with improvement in symptoms  Patient given exercises, stretches and lifestyle modifications  See medications in patient instructions if given  Patient will follow up in 4-8 weeks     The above documentation has been reviewed and is accurate and complete , DO         Note: This dictation was prepared with Dragon dictation along with smaller phrase technology. Any transcriptional errors that result from this process are unintentional.

## 2022-03-06 ENCOUNTER — Ambulatory Visit (INDEPENDENT_AMBULATORY_CARE_PROVIDER_SITE_OTHER): Payer: Managed Care, Other (non HMO) | Admitting: Family Medicine

## 2022-03-06 ENCOUNTER — Encounter: Payer: Self-pay | Admitting: Family Medicine

## 2022-03-06 VITALS — BP 112/84 | HR 84 | Ht 62.0 in

## 2022-03-06 DIAGNOSIS — M533 Sacrococcygeal disorders, not elsewhere classified: Secondary | ICD-10-CM | POA: Diagnosis not present

## 2022-03-06 DIAGNOSIS — M9902 Segmental and somatic dysfunction of thoracic region: Secondary | ICD-10-CM | POA: Diagnosis not present

## 2022-03-06 DIAGNOSIS — M9903 Segmental and somatic dysfunction of lumbar region: Secondary | ICD-10-CM

## 2022-03-06 DIAGNOSIS — M9901 Segmental and somatic dysfunction of cervical region: Secondary | ICD-10-CM

## 2022-03-06 DIAGNOSIS — M9904 Segmental and somatic dysfunction of sacral region: Secondary | ICD-10-CM

## 2022-03-06 DIAGNOSIS — M9908 Segmental and somatic dysfunction of rib cage: Secondary | ICD-10-CM | POA: Diagnosis not present

## 2022-03-06 MED ORDER — METHOCARBAMOL 500 MG PO TABS: 500.0000 mg | ORAL_TABLET | Freq: Three times a day (TID) | ORAL | 1 refills | Status: DC | PRN

## 2022-03-06 NOTE — Assessment & Plan Note (Signed)
Continues to make some progress overall.  Still has some tingling from time to time but has made improvement.  Continue to work on the hip abductor strengthening.  Refilled patient's muscle relaxer for her trip.  Discussed icing regimen and home exercises otherwise follow-up again in 6 to 8 weeks

## 2022-03-06 NOTE — Patient Instructions (Addendum)
Good to see you Keep doing gabapentin Robaxin sent in See me in 7-8 weeks

## 2022-03-10 ENCOUNTER — Other Ambulatory Visit: Payer: Self-pay | Admitting: Family Medicine

## 2022-03-20 ENCOUNTER — Telehealth: Payer: Self-pay | Admitting: Family Medicine

## 2022-03-20 NOTE — Telephone Encounter (Signed)
Per a verbal from Dr. Katrinka Blazing patient is to start steroid prior to her trip. Patient notified.

## 2022-03-20 NOTE — Telephone Encounter (Signed)
Pt seen 2 weeks ago for back pain. Wanted Dr. Katrinka Blazing to know she is not feeling better ( ebbs and flows but currently unable to sit through a movie)  Leaving THIS Monday for a trip and is worried. Wanted to know if she is due for an injection ( which we have very little time to do )  Also, please note Dr. Katrinka Blazing gave her an RX for steroids, which she has on hand but has NOT started. Willing to start with his approval.

## 2022-04-18 ENCOUNTER — Other Ambulatory Visit: Payer: Self-pay | Admitting: Sports Medicine

## 2022-04-20 ENCOUNTER — Telehealth: Payer: Self-pay | Admitting: Family Medicine

## 2022-04-20 ENCOUNTER — Other Ambulatory Visit: Payer: Self-pay

## 2022-04-20 MED ORDER — METHOCARBAMOL 500 MG PO TABS
500.0000 mg | ORAL_TABLET | Freq: Three times a day (TID) | ORAL | 1 refills | Status: DC | PRN
Start: 2022-04-20 — End: 2024-01-03

## 2022-04-20 NOTE — Telephone Encounter (Signed)
Rx filled and patient notified. 

## 2022-04-20 NOTE — Telephone Encounter (Signed)
Pt requesting refill of Robaxin to [facility] on [address].

## 2022-04-21 NOTE — Progress Notes (Signed)
[name] [address] [place] [address] Phone: [phone] Subjective:   Patty [name], am serving as a scribe for Dr. Antoine Primas.  I'm seeing this patient by the request  of:  Scifres, Dorothy, PA-C (Inactive)  CC: Back and neck pain follow-up  [id number]  Patty [name] is a 46 y.o. female coming in with complaint of back and neck pain. OMT 03/06/2022. Patient states doing well. Having some tingling in her upper back and neck area. No new complaints  Medications patient has been prescribed: Robaxin, Gabapentin  Taking:         Reviewed prior external information including notes and imaging from previsou exam, outside providers and external EMR if available.   As well as notes that were available from care everywhere and other healthcare systems.  Past medical history, social, surgical and family history all reviewed in electronic medical record.  No pertanent information unless stated regarding to the chief complaint.   Past Medical History:  Diagnosis Date   Chronic back pain    officially undiagnosed    No Known Allergies   Review of Systems:  No headache, visual changes, nausea, vomiting, diarrhea, constipation, dizziness, abdominal pain, skin rash, fevers, chills, night sweats, weight loss, swollen lymph nodes, body aches, joint swelling, chest pain, shortness of breath, mood changes. POSITIVE muscle aches  Objective  Blood pressure 114/74, pulse 77, height 5\' 2"  (1.575 m), weight 126 lb (57.2 kg), SpO2 99 %.   General: No apparent distress alert and oriented x3 mood and affect normal, dressed appropriately.  HEENT: Pupils equal, extraocular movements intact  Respiratory: Patient's speak in full sentences and does not appear short of breath  Cardiovascular: No lower extremity edema, non tender, no erythema  Gait MSK:  Back does have some loss of lordosis.  Patient does have some tenderness to palpation in the  paraspinal musculature.  Patient does have significant discomfort over the sacroiliac joints still noted bilaterally right greater than left.  Osteopathic findings  C2 flexed rotated and side bent right C6 flexed rotated and side bent left T3 extended rotated and side bent right inhaled rib T9 extended rotated and side bent left L2 flexed rotated and side bent right Sacrum right on right       Assessment and Plan:  SI (sacroiliac) joint dysfunction Patient does have some tightness noted.  Still responding extremely well to osteopathic manipulation.  Discussed with patient again now with the increase in neck pain and lifting this could be contributing somewhat.  We discussed different medications and possibly increasing the gabapentin to 3 pills at night if needed.  Has tried meloxicam and gets better with benefit with ibuprofen.  Given a wait-and-see prednisone dose again.  Follow-up with me again 6 to 8 weeks.  Did give patient 3 materials on the Effexor.    Nonallopathic problems  Decision today to treat with OMT was based on Physical Exam  After verbal consent patient was treated with HVLA, ME, FPR techniques in cervical, rib, thoracic, lumbar, and sacral  areas  Patient tolerated the procedure well with improvement in symptoms  Patient given exercises, stretches and lifestyle modifications  See medications in patient instructions if given  Patient will follow up in 4-8 weeks     The above documentation has been reviewed and is accurate and complete , DO         Note: This dictation was prepared with Dragon dictation along with smaller phrase  technology. Any transcriptional errors that result from this process are unintentional.

## 2022-05-01 ENCOUNTER — Ambulatory Visit (INDEPENDENT_AMBULATORY_CARE_PROVIDER_SITE_OTHER): Payer: Managed Care, Other (non HMO) | Admitting: Family Medicine

## 2022-05-01 VITALS — BP 114/74 | HR 77 | Ht 62.0 in | Wt 126.0 lb

## 2022-05-01 DIAGNOSIS — M533 Sacrococcygeal disorders, not elsewhere classified: Secondary | ICD-10-CM | POA: Diagnosis not present

## 2022-05-01 DIAGNOSIS — M9908 Segmental and somatic dysfunction of rib cage: Secondary | ICD-10-CM | POA: Diagnosis not present

## 2022-05-01 DIAGNOSIS — M9902 Segmental and somatic dysfunction of thoracic region: Secondary | ICD-10-CM | POA: Diagnosis not present

## 2022-05-01 DIAGNOSIS — M9903 Segmental and somatic dysfunction of lumbar region: Secondary | ICD-10-CM | POA: Diagnosis not present

## 2022-05-01 DIAGNOSIS — M9901 Segmental and somatic dysfunction of cervical region: Secondary | ICD-10-CM | POA: Diagnosis not present

## 2022-05-01 DIAGNOSIS — M9904 Segmental and somatic dysfunction of sacral region: Secondary | ICD-10-CM | POA: Diagnosis not present

## 2022-05-01 MED ORDER — PREDNISONE 20 MG PO TABS
40.0000 mg | ORAL_TABLET | Freq: Every day | ORAL | 0 refills | Status: DC
Start: 2022-05-01 — End: 2024-01-03

## 2022-05-01 NOTE — Patient Instructions (Addendum)
Good to see you! You should point more and make other people do the lifting  See me again in 5-6 weeks

## 2022-05-01 NOTE — Assessment & Plan Note (Signed)
Patient does have some tightness noted.  Still responding extremely well to osteopathic manipulation.  Discussed with patient again now with the increase in neck pain and lifting this could be contributing somewhat.  We discussed different medications and possibly increasing the gabapentin to 3 pills at night if needed.  Has tried meloxicam and gets better with benefit with ibuprofen.  Given a wait-and-see prednisone dose again.  Follow-up with me again 6 to 8 weeks.  Did give patient 3 materials on the Effexor.

## 2022-05-28 ENCOUNTER — Telehealth: Payer: Self-pay | Admitting: Family Medicine

## 2022-05-28 ENCOUNTER — Other Ambulatory Visit: Payer: Self-pay

## 2022-05-28 MED ORDER — GABAPENTIN 100 MG PO CAPS
200.0000 mg | ORAL_CAPSULE | Freq: Every day | ORAL | 0 refills | Status: DC
Start: 2022-05-28 — End: 2022-07-24

## 2022-05-28 NOTE — Telephone Encounter (Signed)
Rx refilled.

## 2022-05-28 NOTE — Telephone Encounter (Signed)
Patient called requesting a refill on gabapentin (NEURONTIN) 100 MG capsule to be sent to [facility] on [address].

## 2022-05-28 NOTE — Progress Notes (Signed)
  [place] Phone: [phone] Subjective:   Patty [name], am serving as a scribe for Dr. Hulan Saas.  I'm seeing this patient by the request  of:  Scifres, Dorothy, PA-C (Inactive)  CC: Low back and neck pain follow-up  [id number]  Patty [name] is a 46 y.o. female coming in with complaint of back and neck pain. OMT 05/01/2022. Patient states that she is out of gabapentin. Back pain is worse in lumbar spine.     Medications patient has been prescribed: Prednisone, Gabapentin  Taking:         Reviewed prior external information including notes and imaging from previsou exam, outside providers and external EMR if available.   As well as notes that were available from care everywhere and other healthcare systems.  Past medical history, social, surgical and family history all reviewed in electronic medical record.  [name] pertanent information unless stated regarding to the chief complaint.   Past Medical History:  Diagnosis Date   Chronic back pain    officially undiagnosed    [name] Known Allergies   Review of Systems:  [name] headache, visual changes, nausea, vomiting, diarrhea, constipation, dizziness, abdominal pain, skin rash, fevers, chills, night sweats, weight loss, swollen lymph nodes, body aches, joint swelling, chest pain, shortness of breath, mood changes. POSITIVE muscle aches  Objective  Blood pressure 100/62, pulse 73, height 5\' 2"  (1.575 m), weight 123 lb (55.8 kg), SpO2 99 %.   General: [name] apparent distress alert and oriented x3 mood and affect normal, dressed appropriately.  HEENT: Pupils equal, extraocular movements intact  Respiratory: Patient's speak in full sentences and does not appear short of breath  Cardiovascular: [name] lower extremity edema, non tender, [name] erythema  MSK:  Back low back does have some loss of lordosis.  Some tenderness to palpation in the paraspinal  musculature.  Osteopathic findings  C2 flexed rotated and side bent right C7 flexed rotated and side bent left T3 extended rotated and side bent right inhaled rib T8 extended rotated and side bent left L1 flexed rotated and side bent right Sacrum right on right       Assessment and Plan:  SI (sacroiliac) joint dysfunction Patient did have exacerbation.  Has been doing more lifting.  Discontinued the gabapentin initially but will restart. Extremity 3 pills daily for 3 days and then 2 pills afterwards.  Patient does do well with the breakthrough for with the methocarbamol when needed as well.  This would be a good idea for this exacerbation.  We will follow-up again in 6 weeks    Nonallopathic problems  Decision today to treat with OMT was based on Physical Exam  After verbal consent patient was treated with HVLA, ME, FPR techniques in cervical, rib, thoracic, lumbar, and sacral  areas  Patient tolerated the procedure well with improvement in symptoms  Patient given exercises, stretches and lifestyle modifications  See medications in patient instructions if given  Patient will follow up in 4-8 weeks     The above documentation has been reviewed and is accurate and complete Patty Pulley, DO         Note: This dictation was prepared with Dragon dictation along with smaller phrase technology. Any transcriptional errors that result from this process are unintentional.

## 2022-05-29 ENCOUNTER — Encounter: Payer: Self-pay | Admitting: Family Medicine

## 2022-05-29 ENCOUNTER — Ambulatory Visit (INDEPENDENT_AMBULATORY_CARE_PROVIDER_SITE_OTHER): Payer: Managed Care, Other (non HMO) | Admitting: Family Medicine

## 2022-05-29 VITALS — BP 100/62 | HR 73 | Ht 62.0 in | Wt 123.0 lb

## 2022-05-29 DIAGNOSIS — M9904 Segmental and somatic dysfunction of sacral region: Secondary | ICD-10-CM | POA: Diagnosis not present

## 2022-05-29 DIAGNOSIS — M9901 Segmental and somatic dysfunction of cervical region: Secondary | ICD-10-CM

## 2022-05-29 DIAGNOSIS — M533 Sacrococcygeal disorders, not elsewhere classified: Secondary | ICD-10-CM

## 2022-05-29 DIAGNOSIS — M9908 Segmental and somatic dysfunction of rib cage: Secondary | ICD-10-CM

## 2022-05-29 DIAGNOSIS — M9903 Segmental and somatic dysfunction of lumbar region: Secondary | ICD-10-CM

## 2022-05-29 DIAGNOSIS — M9902 Segmental and somatic dysfunction of thoracic region: Secondary | ICD-10-CM | POA: Diagnosis not present

## 2022-05-29 NOTE — Patient Instructions (Signed)
Good to see you Careful unpacking See me in 5-6 weeks

## 2022-05-29 NOTE — Assessment & Plan Note (Signed)
Patient did have exacerbation.  Has been doing more lifting.  Discontinued the gabapentin initially but will restart. Extremity 3 pills daily for 3 days and then 2 pills afterwards.  Patient does do well with the breakthrough for with the methocarbamol when needed as well.  This would be a good idea for this exacerbation.  We will follow-up again in 6 weeks

## 2022-07-08 NOTE — Progress Notes (Signed)
[name] [address] [place] [address] Phone: [phone] Subjective:   Patty [name], am serving as a scribe for Dr. Antoine Primas.  I'm seeing this patient by the request  of:  Scifres, Dorothy, PA-C (Inactive)  CC: Back and neck pain follow-up  [id number]  Patty [name] is a 46 y.o. female coming in with complaint of back and neck pain. OMT 05/29/2022. Patient states that her pain has not changed. No major flare ups. Using IBU daily.   Medications patient has been prescribed: Robaxin, Gabapentin  Taking:         Reviewed prior external information including notes and imaging from previsou exam, outside providers and external EMR if available.   As well as notes that were available from care everywhere and other healthcare systems.  Past medical history, social, surgical and family history all reviewed in electronic medical record.  No pertanent information unless stated regarding to the chief complaint.   Past Medical History:  Diagnosis Date   Chronic back pain    officially undiagnosed    No Known Allergies   Review of Systems:  No headache, visual changes, nausea, vomiting, diarrhea, constipation, dizziness, abdominal pain, skin rash, fevers, chills, night sweats, weight loss, swollen lymph nodes, body aches, joint swelling, chest pain, shortness of breath, mood changes. POSITIVE muscle aches  Objective  Blood pressure 122/68, pulse 67, height 5\' 2"  (1.575 m), weight 127 lb (57.6 kg), SpO2 99 %.   General: No apparent distress alert and oriented x3 mood and affect normal, dressed appropriately.  HEENT: Pupils equal, extraocular movements intact  Respiratory: Patient's speak in full sentences and does not appear short of breath  Cardiovascular: No lower extremity edema, non tender, no erythema  Gait MSK:  Back patient does have some tightness still noted around the sacroiliac joint.  Still seems to be right  greater than left.  Patient noted does seem to have better strength and core than previous exam.  Osteopathic findings  C2 flexed rotated and side bent right C6 flexed rotated and side bent left T3 extended rotated and side bent right inhaled rib T9 extended rotated and side bent left inhaled rib L2 flexed rotated and side bent right Sacrum right on right Anterior ilium on the right      Assessment and Plan:  SI (sacroiliac) joint dysfunction Patient seems to be doing well and he is strengthening her core.  I do think that the patient does have the Flexeril for breakthrough if necessary, gabapentin 100 to 200 mg at night, and I am hoping the patient does extremely well with this.  Discussed with patient about the posture and ergonomics.  Patient does respond well to osteopathic manipulation so far.  Follow-up again in 6 to 8 weeks otherwise.    Nonallopathic problems  Decision today to treat with OMT was based on Physical Exam  After verbal consent patient was treated with HVLA, ME, FPR techniques in cervical, rib, thoracic, lumbar, and sacral and pelvis areas  Patient tolerated the procedure well with improvement in symptoms  Patient given exercises, stretches and lifestyle modifications  See medications in patient instructions if given  Patient will follow up in 4-8 weeks    The above documentation has been reviewed and is accurate and complete , DO          Note: This dictation was prepared with Dragon dictation along with smaller phrase technology. Any transcriptional errors that result from this  process are unintentional.

## 2022-07-10 ENCOUNTER — Ambulatory Visit (INDEPENDENT_AMBULATORY_CARE_PROVIDER_SITE_OTHER): Payer: Managed Care, Other (non HMO) | Admitting: Family Medicine

## 2022-07-10 VITALS — BP 122/68 | HR 67 | Ht 62.0 in | Wt 127.0 lb

## 2022-07-10 DIAGNOSIS — M9905 Segmental and somatic dysfunction of pelvic region: Secondary | ICD-10-CM

## 2022-07-10 DIAGNOSIS — M533 Sacrococcygeal disorders, not elsewhere classified: Secondary | ICD-10-CM | POA: Diagnosis not present

## 2022-07-10 DIAGNOSIS — M9903 Segmental and somatic dysfunction of lumbar region: Secondary | ICD-10-CM | POA: Diagnosis not present

## 2022-07-10 DIAGNOSIS — M9904 Segmental and somatic dysfunction of sacral region: Secondary | ICD-10-CM

## 2022-07-10 DIAGNOSIS — M9908 Segmental and somatic dysfunction of rib cage: Secondary | ICD-10-CM

## 2022-07-10 DIAGNOSIS — M9901 Segmental and somatic dysfunction of cervical region: Secondary | ICD-10-CM | POA: Diagnosis not present

## 2022-07-10 DIAGNOSIS — M9902 Segmental and somatic dysfunction of thoracic region: Secondary | ICD-10-CM

## 2022-07-10 NOTE — Assessment & Plan Note (Signed)
Patient seems to be doing well and he is strengthening her core.  I do think that the patient does have the Flexeril for breakthrough if necessary, gabapentin 100 to 200 mg at night, and I am hoping the patient does extremely well with this.  Discussed with patient about the posture and ergonomics.  Patient does respond well to osteopathic manipulation so far.  Follow-up again in 6 to 8 weeks otherwise.

## 2022-07-10 NOTE — Patient Instructions (Signed)
See me in 8 weeks  

## 2022-07-24 ENCOUNTER — Telehealth: Payer: Self-pay | Admitting: Family Medicine

## 2022-07-24 ENCOUNTER — Other Ambulatory Visit: Payer: Self-pay

## 2022-07-24 MED ORDER — GABAPENTIN 100 MG PO CAPS: 200.0000 mg | ORAL_CAPSULE | Freq: Every day | ORAL | 0 refills | Status: DC

## 2022-07-24 NOTE — Telephone Encounter (Signed)
Patient called requesting a refill on gabapentin (NEURONTIN) 100 MG capsule.  Pharmacy: [facility]

## 2022-07-24 NOTE — Telephone Encounter (Signed)
Rx filled

## 2022-08-13 NOTE — Progress Notes (Signed)
[name] [address] [place] [address] Phone: [phone] Subjective:   [name], am serving as a scribe for Dr. Antoine Primas.  I'm seeing this patient by the request  of:  Scifres, Dorothy, PA-C (Inactive)  CC: Low back pain  [id number]  Patty [name] is a 46 y.o. female coming in with complaint of back and neck pain. OMT 07/10/2022. Patient states that her lower back has been worse since last visit. Pain in coccyx and feels cool.  Patient states that it is just more of a discomfort.  Has been more active.  Did have a 2-hour drive that did seem to aggravate it.  Medications patient has been prescribed: Gabapentin  Taking: Yes         Reviewed prior external information including notes and imaging from previsou exam, outside providers and external EMR if available.   As well as notes that were available from care everywhere and other healthcare systems.  Past medical history, social, surgical and family history all reviewed in electronic medical record.  No pertanent information unless stated regarding to the chief complaint.   Past Medical History:  Diagnosis Date   Chronic back pain    officially undiagnosed    No Known Allergies   Review of Systems:  No headache, visual changes, nausea, vomiting, diarrhea, constipation, dizziness, abdominal pain, skin rash, fevers, chills, night sweats, weight loss, swollen lymph nodes, body aches, joint swelling, chest pain, shortness of breath, mood changes. POSITIVE muscle aches  Objective  Blood pressure 112/74, pulse 68, height 5\' 2"  (1.575 m), weight 138 lb (62.6 kg), SpO2 99 %.   General: No apparent distress alert and oriented x3 mood and affect normal, dressed appropriately.  HEENT: Pupils equal, extraocular movements intact  Respiratory: Patient's speak in full sentences and does not appear short of breath  Cardiovascular: No lower extremity edema, non tender, no  erythema  Low back exam still has more SI joint discomfort noted today.  Patient continues to have some tightness in the neck as well but nothing severe enough that it stops her from activity.  Patient did have significant exacerbation from a car ride and we will need to continue to monitor.  Osteopathic findings  C2 flexed rotated and side bent right C6 flexed rotated and side bent left T3 extended rotated and side bent right inhaled rib T8 extended rotated and side bent left L2 flexed rotated and side bent right Sacrum right on right       Assessment and Plan:  SI (sacroiliac) joint dysfunction Patient does have more sacroiliac joint dysfunction.  Patient does respond relatively well.  Seem to be more on the left than the right.  Did have a little pelvic shear noted today as well that we did need to continue to monitor.  Discussed with patient about icing regimen and home exercises, which activities to do and which ones to avoid.  Follow-up again in 6 to 8 weeks wound had to refill gabapentin 200 mg at night    Nonallopathic problems  Decision today to treat with OMT was based on Physical Exam  After verbal consent patient was treated with HVLA, ME, FPR techniques in cervical, rib, thoracic, lumbar, and sacral  areas  Patient tolerated the procedure well with improvement in symptoms  Patient given exercises, stretches and lifestyle modifications  See medications in patient instructions if given  Patient will follow up in 4-8 weeks    The above documentation has  been reviewed and is accurate and complete Lyndal Pulley, DO          Note: This dictation was prepared with Dragon dictation along with smaller phrase technology. Any transcriptional errors that result from this process are unintentional.

## 2022-08-21 ENCOUNTER — Encounter: Payer: Self-pay | Admitting: Family Medicine

## 2022-08-21 ENCOUNTER — Ambulatory Visit (INDEPENDENT_AMBULATORY_CARE_PROVIDER_SITE_OTHER): Payer: Managed Care, Other (non HMO) | Admitting: Family Medicine

## 2022-08-21 VITALS — BP 112/74 | HR 68 | Ht 62.0 in | Wt 138.0 lb

## 2022-08-21 DIAGNOSIS — M9908 Segmental and somatic dysfunction of rib cage: Secondary | ICD-10-CM

## 2022-08-21 DIAGNOSIS — M9903 Segmental and somatic dysfunction of lumbar region: Secondary | ICD-10-CM | POA: Diagnosis not present

## 2022-08-21 DIAGNOSIS — M9902 Segmental and somatic dysfunction of thoracic region: Secondary | ICD-10-CM

## 2022-08-21 DIAGNOSIS — M9901 Segmental and somatic dysfunction of cervical region: Secondary | ICD-10-CM | POA: Diagnosis not present

## 2022-08-21 DIAGNOSIS — M9904 Segmental and somatic dysfunction of sacral region: Secondary | ICD-10-CM

## 2022-08-21 DIAGNOSIS — M533 Sacrococcygeal disorders, not elsewhere classified: Secondary | ICD-10-CM | POA: Diagnosis not present

## 2022-08-21 MED ORDER — GABAPENTIN 100 MG PO CAPS: 200.0000 mg | ORAL_CAPSULE | Freq: Every day | ORAL | 0 refills | Status: DC

## 2022-08-21 NOTE — Patient Instructions (Signed)
Refilled gabapentin See me in 6-8 weeks

## 2022-08-21 NOTE — Assessment & Plan Note (Addendum)
Patient does have more sacroiliac joint dysfunction.  Patient does respond relatively well.  Seem to be more on the left than the right.  Did have a little pelvic shear noted today as well that we did need to continue to monitor.  Discussed with patient about icing regimen and home exercises, which activities to do and which ones to avoid.  Follow-up again in 6 to 8 weeks wound had to refill gabapentin 200 mg at night

## 2022-09-30 NOTE — Progress Notes (Signed)
[place] [employer] Phone: [phone] Subjective:   Fontaine No, am serving as a scribe for Dr. Hulan Saas.  I'm seeing this patient by the request  of:  Scifres, Dorothy, PA-C (Inactive)  CC: Back and neck pain  [id number]  Patty [name] is a 47 y.o. female coming in with complaint of back and neck pain. OMT 08/21/2022. Patient states that she was sore after the last visit and had 2 weeks of increased pain. Pain has improved. Used muscle relaxer and increased gabapentin when she had increase in pain. Unsure if this was helpful.   Medications patient has been prescribed: Gabapentin  Taking: Yes         Reviewed prior external information including notes and imaging from previsou exam, outside providers and external EMR if available.   As well as notes that were available from care everywhere and other healthcare systems.  Past medical history, social, surgical and family history all reviewed in electronic medical record.  No pertanent information unless stated regarding to the chief complaint.   Past Medical History:  Diagnosis Date   Chronic back pain    officially undiagnosed    No Known Allergies   Review of Systems:  No headache, visual changes, nausea, vomiting, diarrhea, constipation, dizziness, abdominal pain, skin rash, fevers, chills, night sweats, weight loss, swollen lymph nodes, body aches, joint swelling, chest pain, shortness of breath, mood changes. POSITIVE muscle aches  Objective  Blood pressure 120/82, pulse 77, height 5\' 2"  (1.575 m), weight 127 lb (57.6 kg), SpO2 99 %.   General: No apparent distress alert and oriented x3 mood and affect normal, dressed appropriately.  HEENT: Pupils equal, extraocular movements intact  Respiratory: Patient's speak in full sentences and does not appear short of breath  Cardiovascular: No lower extremity edema, non tender, no erythema  Low  back exam still has some tightness noted in the paraspinal musculature.  Still tightness mostly around the sacroiliac joint right greater than left.  Patient's pain does seem to be out of proportion to the amount of palpation.  Negative straight leg test noted though.  Osteopathic findings  C6 flexed rotated and side bent left T3 extended rotated and side bent right inhaled rib T8 extended rotated and side bent left L2 flexed rotated and side bent right L5 flexed rotated and side bent left Sacrum right on right       Assessment and Plan:  SI (sacroiliac) joint dysfunction Continues to have sacroiliac pain that seems to be out of proportion.  We did discuss different medications and started patient on Effexor.  Warned of potential side effects with this medication.  Should do relatively well at home.  Discussed icing regimen and home exercises, core stability.  Follow-up again in 6 to 8 weeks    Nonallopathic problems  Decision today to treat with OMT was based on Physical Exam  After verbal consent patient was treated with HVLA, ME, FPR techniques in cervical, rib, thoracic, lumbar, and sacral  areas  Patient tolerated the procedure well with improvement in symptoms  Patient given exercises, stretches and lifestyle modifications  See medications in patient instructions if given  Patient will follow up in 4-8 weeks    The above documentation has been reviewed and is accurate and complete Lyndal Pulley, DO          Note: This dictation was prepared with Dragon dictation along with smaller phrase technology. Any transcriptional  errors that result from this process are unintentional.

## 2022-10-02 ENCOUNTER — Ambulatory Visit (INDEPENDENT_AMBULATORY_CARE_PROVIDER_SITE_OTHER): Payer: Commercial Managed Care - PPO | Admitting: Family Medicine

## 2022-10-02 VITALS — BP 120/82 | HR 77 | Ht 62.0 in | Wt 127.0 lb

## 2022-10-02 DIAGNOSIS — M9908 Segmental and somatic dysfunction of rib cage: Secondary | ICD-10-CM | POA: Diagnosis not present

## 2022-10-02 DIAGNOSIS — M9903 Segmental and somatic dysfunction of lumbar region: Secondary | ICD-10-CM

## 2022-10-02 DIAGNOSIS — M9901 Segmental and somatic dysfunction of cervical region: Secondary | ICD-10-CM | POA: Diagnosis not present

## 2022-10-02 DIAGNOSIS — M9904 Segmental and somatic dysfunction of sacral region: Secondary | ICD-10-CM

## 2022-10-02 DIAGNOSIS — M9902 Segmental and somatic dysfunction of thoracic region: Secondary | ICD-10-CM

## 2022-10-02 DIAGNOSIS — M533 Sacrococcygeal disorders, not elsewhere classified: Secondary | ICD-10-CM

## 2022-10-02 MED ORDER — VENLAFAXINE HCL ER 37.5 MG PO CP24: 37.5000 mg | ORAL_CAPSULE | Freq: Every day | ORAL | 0 refills | Status: DC

## 2022-10-02 NOTE — Patient Instructions (Addendum)
Effexor 37.12m prescribed Enjoy Date day Continue to stay active See you again in 5-6 weeks

## 2022-10-02 NOTE — Assessment & Plan Note (Signed)
Continues to have sacroiliac pain that seems to be out of proportion.  We did discuss different medications and started patient on Effexor.  Warned of potential side effects with this medication.  Should do relatively well at home.  Discussed icing regimen and home exercises, core stability.  Follow-up again in 6 to 8 weeks

## 2022-10-25 ENCOUNTER — Other Ambulatory Visit: Payer: Self-pay | Admitting: Family Medicine

## 2022-11-04 NOTE — Progress Notes (Signed)
  [place] Phone: [phone] Subjective:   Patty [name], am serving as a scribe for Dr. Hulan Saas.  I'm seeing this patient by the request  of:  [name], Dorothy, PA-C (Inactive)  CC: Back and neck pain follow-up  [id number]  Patty [name] is a 47 y.o. female coming in with complaint of back and neck pain. OMT 10/02/2022. Patient states that she is doing good. [name] new issues since last visit.  Doing better with the Effexor.  Has not had to take any other pain medications at this time. Medications patient has been prescribed: Gabapentin, Effexor  Taking: Yes         Reviewed prior external information including notes and imaging from previsou exam, outside providers and external EMR if available.   As well as notes that were available from care everywhere and other healthcare systems.  Past medical history, social, surgical and family history all reviewed in electronic medical record.  [name] pertanent information unless stated regarding to the chief complaint.   Past Medical History:  Diagnosis Date   Chronic back pain    officially undiagnosed    [name] Known Allergies   Review of Systems:  [name] headache, visual changes, nausea, vomiting, diarrhea, constipation, dizziness, abdominal pain, skin rash, fevers, chills, night sweats, weight loss, swollen lymph nodes, body aches, joint swelling, chest pain, shortness of breath, mood changes. POSITIVE muscle aches  Objective  Blood pressure (!) 138/90, pulse 85, height 5\' 2"  (1.575 m), weight 127 lb (57.6 kg), SpO2 98 %.   General: [name] apparent distress alert and oriented x3 mood and affect normal, dressed appropriately.  HEENT: Pupils equal, extraocular movements intact  Respiratory: Patient's speak in full sentences and does not appear short of breath  Cardiovascular: [name] lower extremity edema, non tender, [name] erythema  Back exam does have some mild  loss of lordosis.  Patient does not have improvement with certain range of motion.  Still tender to palpation over the right sacroiliac joint more than the left.  Osteopathic findings  C2 flexed rotated and side bent right C6 flexed rotated and side bent left T3 extended rotated and side bent right inhaled rib T9 extended rotated and side bent left L2 flexed rotated and side bent right Sacrum right on right       Assessment and Plan:  SI (sacroiliac) joint dysfunction Patient has done much better with the Effexor at this time.  Continued significant improvement.  Patient is still struggling with this.  Increase activity.  Discussed with patient about icing regimen and home exercises otherwise.  Follow-up with me again in 6 to 8 weeks    Nonallopathic problems  Decision today to treat with OMT was based on Physical Exam  After verbal consent patient was treated with HVLA, ME, FPR techniques in cervical, rib, thoracic, lumbar, and sacral  areas  Patient tolerated the procedure well with improvement in symptoms  Patient given exercises, stretches and lifestyle modifications  See medications in patient instructions if given  Patient will follow up in 4-8 weeks    The above documentation has been reviewed and is accurate and complete Lyndal Pulley, DO          Note: This dictation was prepared with Dragon dictation along with smaller phrase technology. Any transcriptional errors that result from this process are unintentional.

## 2022-11-09 ENCOUNTER — Ambulatory Visit (INDEPENDENT_AMBULATORY_CARE_PROVIDER_SITE_OTHER): Payer: Commercial Managed Care - PPO | Admitting: Family Medicine

## 2022-11-09 ENCOUNTER — Encounter: Payer: Self-pay | Admitting: Family Medicine

## 2022-11-09 VITALS — BP 138/90 | HR 85 | Ht 62.0 in | Wt 127.0 lb

## 2022-11-09 DIAGNOSIS — M9904 Segmental and somatic dysfunction of sacral region: Secondary | ICD-10-CM | POA: Diagnosis not present

## 2022-11-09 DIAGNOSIS — M9901 Segmental and somatic dysfunction of cervical region: Secondary | ICD-10-CM

## 2022-11-09 DIAGNOSIS — M9902 Segmental and somatic dysfunction of thoracic region: Secondary | ICD-10-CM

## 2022-11-09 DIAGNOSIS — M9908 Segmental and somatic dysfunction of rib cage: Secondary | ICD-10-CM | POA: Diagnosis not present

## 2022-11-09 DIAGNOSIS — M533 Sacrococcygeal disorders, not elsewhere classified: Secondary | ICD-10-CM

## 2022-11-09 DIAGNOSIS — M9903 Segmental and somatic dysfunction of lumbar region: Secondary | ICD-10-CM | POA: Diagnosis not present

## 2022-11-09 NOTE — Assessment & Plan Note (Signed)
Patient has done much better with the Effexor at this time.  Continued significant improvement.  Patient is still struggling with this.  Increase activity.  Discussed with patient about icing regimen and home exercises otherwise.  Follow-up with me again in 6 to 8 weeks

## 2022-11-09 NOTE — Patient Instructions (Signed)
Sorry for being late [employer] is helping See me in 8 weeks

## 2022-11-18 ENCOUNTER — Other Ambulatory Visit: Payer: Self-pay | Admitting: Family Medicine

## 2022-12-24 NOTE — Progress Notes (Signed)
  [name] [address] [place] [address] Phone: [phone] Subjective:   [name], am serving as a scribe for Dr. Antoine Primas.  I'm seeing this patient by the request  of:  Scifres, Dorothy, PA-C (Inactive)  CC: Back and neck pain follow-up  [id number]  Patty [name] is a 47 y.o. female coming in with complaint of back and neck pain. OMT 11/09/2022.Patient states doing well. Same per usual. No new concerns.  Has had significant more tightness recently.  Medications patient has been prescribed: Effexor  Taking:         Reviewed prior external information including notes and imaging from previsou exam, outside providers and external EMR if available.   As well as notes that were available from care everywhere and other healthcare systems.  Past medical history, social, surgical and family history all reviewed in electronic medical record.  No pertanent information unless stated regarding to the chief complaint.   Past Medical History:  Diagnosis Date   Chronic back pain    officially undiagnosed    No Known Allergies   Review of Systems:  No headache, visual changes, nausea, vomiting, diarrhea, constipation, dizziness, abdominal pain, skin rash, fevers, chills, night sweats, weight loss, swollen lymph nodes, body aches, joint swelling, chest pain, shortness of breath, mood changes. POSITIVE muscle aches  Objective  Blood pressure 122/76, pulse 89, height 5\' 2"  (1.575 m), weight 131 lb (59.4 kg), SpO2 99 %.   General: No apparent distress alert and oriented x3 mood and affect normal, dressed appropriately.  HEENT: Pupils equal, extraocular movements intact  Respiratory: Patient's speak in full sentences and does not appear short of breath  Cardiovascular: No lower extremity edema, non tender, no erythema  \Low back does have some loss of lordosis noted.  Some tenderness to palpation in the paraspinal musculature.   Severely tender over the left sacroiliac joint that is different.  Osteopathic findings  C2 flexed rotated and side bent right T3 extended rotated and side bent right inhaled rib T9 extended rotated and side bent left L2 flexed rotated and side bent right Sacrum left on left       Assessment and Plan:  SI (sacroiliac) joint dysfunction Sacroiliac joint dysfunction still noted. Seems to be an acute problem with worsening symptoms at the moment again.  Patient is return do more strengthening exercises including the hip abductor.  Discussed which activities to do and which ones to avoid.  Meloxicam and gabapentin discussed.  Follow-up again in 6 to 8 weeks    Nonallopathic problems  Decision today to treat with OMT was based on Physical Exam  After verbal consent patient was treated with HVLA, ME, FPR techniques in cervical, rib, thoracic, lumbar, and sacral  areas  Patient tolerated the procedure well with improvement in symptoms  Patient given exercises, stretches and lifestyle modifications  See medications in patient instructions if given  Patient will follow up in 4-8 weeks    The above documentation has been reviewed and is accurate and complete [name], DO          Note: This dictation was prepared with Dragon dictation along with smaller phrase technology. Any transcriptional errors that result from this process are unintentional.

## 2022-12-25 ENCOUNTER — Encounter: Payer: Self-pay | Admitting: Family Medicine

## 2022-12-25 ENCOUNTER — Ambulatory Visit (INDEPENDENT_AMBULATORY_CARE_PROVIDER_SITE_OTHER): Payer: Commercial Managed Care - PPO | Admitting: Family Medicine

## 2022-12-25 VITALS — BP 122/76 | HR 89 | Ht 62.0 in | Wt 131.0 lb

## 2022-12-25 DIAGNOSIS — M533 Sacrococcygeal disorders, not elsewhere classified: Secondary | ICD-10-CM

## 2022-12-25 DIAGNOSIS — M9901 Segmental and somatic dysfunction of cervical region: Secondary | ICD-10-CM

## 2022-12-25 DIAGNOSIS — M9903 Segmental and somatic dysfunction of lumbar region: Secondary | ICD-10-CM | POA: Diagnosis not present

## 2022-12-25 DIAGNOSIS — M9904 Segmental and somatic dysfunction of sacral region: Secondary | ICD-10-CM | POA: Diagnosis not present

## 2022-12-25 DIAGNOSIS — M9902 Segmental and somatic dysfunction of thoracic region: Secondary | ICD-10-CM

## 2022-12-25 DIAGNOSIS — M9908 Segmental and somatic dysfunction of rib cage: Secondary | ICD-10-CM | POA: Diagnosis not present

## 2022-12-25 NOTE — Assessment & Plan Note (Signed)
Sacroiliac joint dysfunction still noted. Seems to be an acute problem with worsening symptoms at the moment again.  Patient is return do more strengthening exercises including the hip abductor.  Discussed which activities to do and which ones to avoid.  Meloxicam and gabapentin discussed.  Follow-up again in 6 to 8 weeks

## 2022-12-25 NOTE — Patient Instructions (Signed)
Good luck with prom Take the meloxicam for 5 days See you again in 5 -6 weeks

## 2023-02-04 NOTE — Progress Notes (Signed)
  Patty [name] [address] [place] [address] Phone: [phone] Subjective:   Patty [name], am serving as a scribe for Dr. Antoine Primas.  I'm seeing this patient by the request  of:  [name], Dorothy, PA-C (Inactive)  CC: Back and neck pain follow-up  [id number]  Patty [name] is a 47 y.o. female coming in with complaint of back and neck pain. OMT 12/25/2022. Patient states same per usual. No new concerns.  Medications patient has been prescribed: Effexor, Gabapentin  Taking:         Reviewed prior external information including notes and imaging from previsou exam, outside providers and external EMR if available.   As well as notes that were available from care everywhere and other healthcare systems.  Past medical history, social, surgical and family history all reviewed in electronic medical record.  No pertanent information unless stated regarding to the chief complaint.   Past Medical History:  Diagnosis Date   Chronic back pain    officially undiagnosed    No Known Allergies   Review of Systems:  No headache, visual changes, nausea, vomiting, diarrhea, constipation, dizziness, abdominal pain, skin rash, fevers, chills, night sweats, weight loss, swollen lymph nodes, body aches, joint swelling, chest pain, shortness of breath, mood changes. POSITIVE muscle aches  Objective  Blood pressure 114/70, pulse 81, height 5\' 2"  (1.575 m), weight 132 lb (59.9 kg), SpO2 98 %.   General: No apparent distress alert and oriented x3 mood and affect normal, dressed appropriately.  HEENT: Pupils equal, extraocular movements intact  Respiratory: Patient's speak in full sentences and does not appear short of breath  Cardiovascular: No lower extremity edema, non tender, no erythema  Tightness in the neck noted especially with sidebending bilaterally right greater than left.  Patient does have tightness of the right sacroiliac joint noted.   Positive FABER test noted.  Osteopathic findings  C3 flexed rotated and side bent right C6 flexed rotated and side bent left T3 extended rotated and side bent right inhaled rib L2 flexed rotated and side bent right L5 flexed rotated and side bent left Sacrum right on right       Assessment and Plan:  SI (sacroiliac) joint dysfunction Continues to respond relatively well to osteopathic manipulation.  Patient is not having any pain that is stopping her from activity and feels like she continues to make some progress.  Discussed with patient about icing regimen and home exercises.  Continue to stay active.  Follow-up with me again in 6 to 8 weeks    Nonallopathic problems  Decision today to treat with OMT was based on Physical Exam  After verbal consent patient was treated with HVLA, ME, FPR techniques in cervical, rib, thoracic, lumbar, and sacral  areas  Patient tolerated the procedure well with improvement in symptoms  Patient given exercises, stretches and lifestyle modifications  See medications in patient instructions if given  Patient will follow up in 4-8 weeks    The above documentation has been reviewed and is accurate and complete Patty Saa, DO          Note: This dictation was prepared with Dragon dictation along with smaller phrase technology. Any transcriptional errors that result from this process are unintentional.

## 2023-02-05 ENCOUNTER — Ambulatory Visit (INDEPENDENT_AMBULATORY_CARE_PROVIDER_SITE_OTHER): Payer: Commercial Managed Care - PPO | Admitting: Family Medicine

## 2023-02-05 ENCOUNTER — Encounter: Payer: Self-pay | Admitting: Family Medicine

## 2023-02-05 VITALS — BP 114/70 | HR 81 | Ht 62.0 in | Wt 132.0 lb

## 2023-02-05 DIAGNOSIS — M9908 Segmental and somatic dysfunction of rib cage: Secondary | ICD-10-CM | POA: Diagnosis not present

## 2023-02-05 DIAGNOSIS — M533 Sacrococcygeal disorders, not elsewhere classified: Secondary | ICD-10-CM | POA: Diagnosis not present

## 2023-02-05 DIAGNOSIS — M9901 Segmental and somatic dysfunction of cervical region: Secondary | ICD-10-CM

## 2023-02-05 DIAGNOSIS — M9904 Segmental and somatic dysfunction of sacral region: Secondary | ICD-10-CM

## 2023-02-05 DIAGNOSIS — M9903 Segmental and somatic dysfunction of lumbar region: Secondary | ICD-10-CM | POA: Diagnosis not present

## 2023-02-05 DIAGNOSIS — M9902 Segmental and somatic dysfunction of thoracic region: Secondary | ICD-10-CM | POA: Diagnosis not present

## 2023-02-05 NOTE — Patient Instructions (Addendum)
Good to see you Have fun in [place] See me in 6-8 weeks

## 2023-02-05 NOTE — Assessment & Plan Note (Signed)
Continues to respond relatively well to osteopathic manipulation.  Patient is not having any pain that is stopping her from activity and feels like she continues to make some progress.  Discussed with patient about icing regimen and home exercises.  Continue to stay active.  Follow-up with me again in 6 to 8 weeks

## 2023-03-02 ENCOUNTER — Other Ambulatory Visit: Payer: Self-pay | Admitting: Family Medicine

## 2023-03-09 IMAGING — MG MM DIGITAL SCREENING BILAT W/ TOMO AND CAD
8 series · 9 of 24 positions shown · non-contrast
Comparison: None.

CLINICAL DATA: Screening.

EXAM:
DIGITAL SCREENING BILATERAL MAMMOGRAM WITH TOMOSYNTHESIS AND CAD
TECHNIQUE: Bilateral screening digital craniocaudal and mediolateral oblique
mammograms were obtained. Bilateral screening digital breast
tomosynthesis was performed. The images were evaluated with
computer-aided detection.

[R MLO synth-2D]
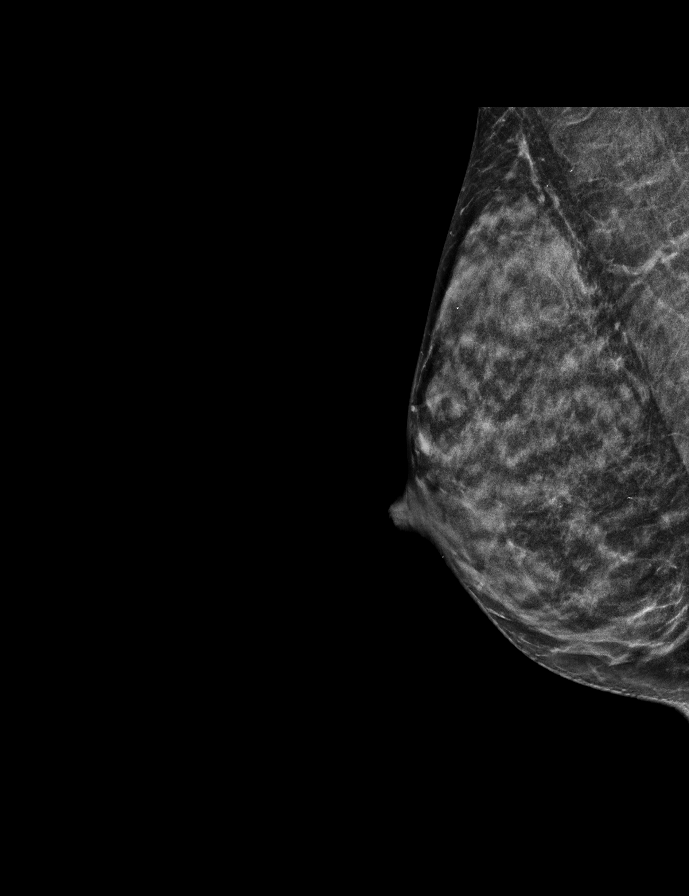

[R CC synth-2D]
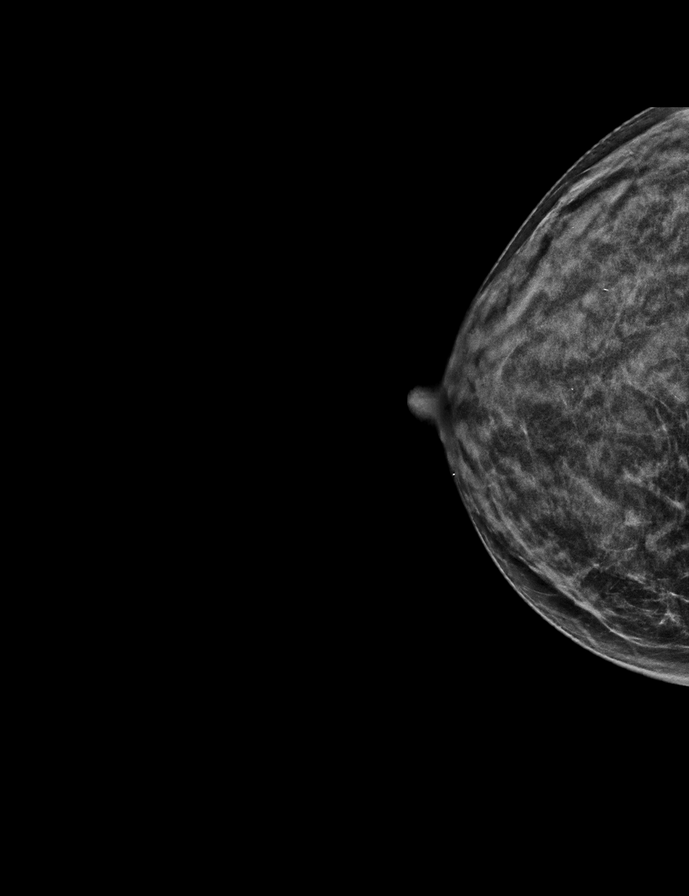

[L CC synth-2D]
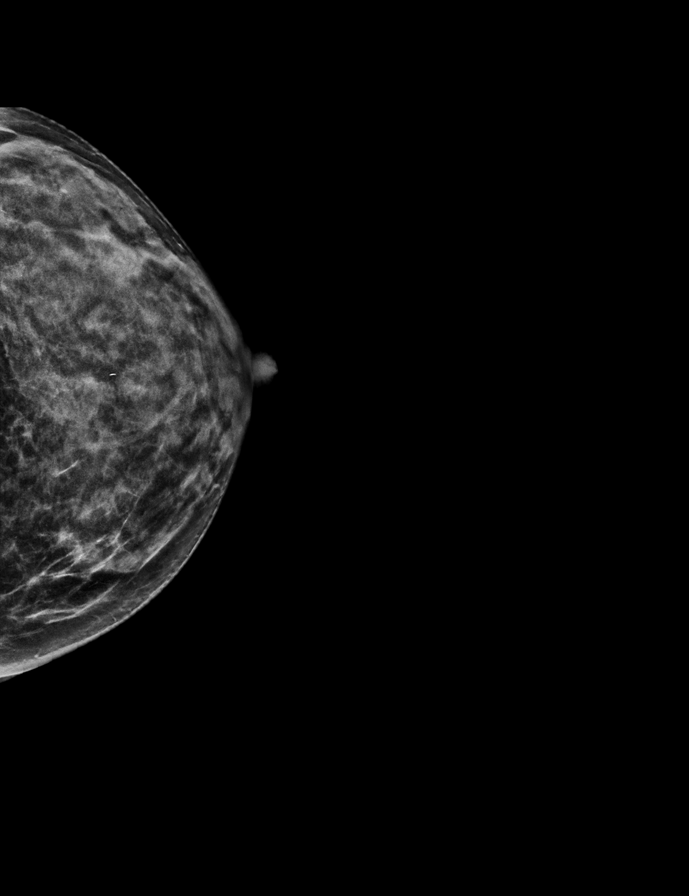

[L MLO synth-2D]
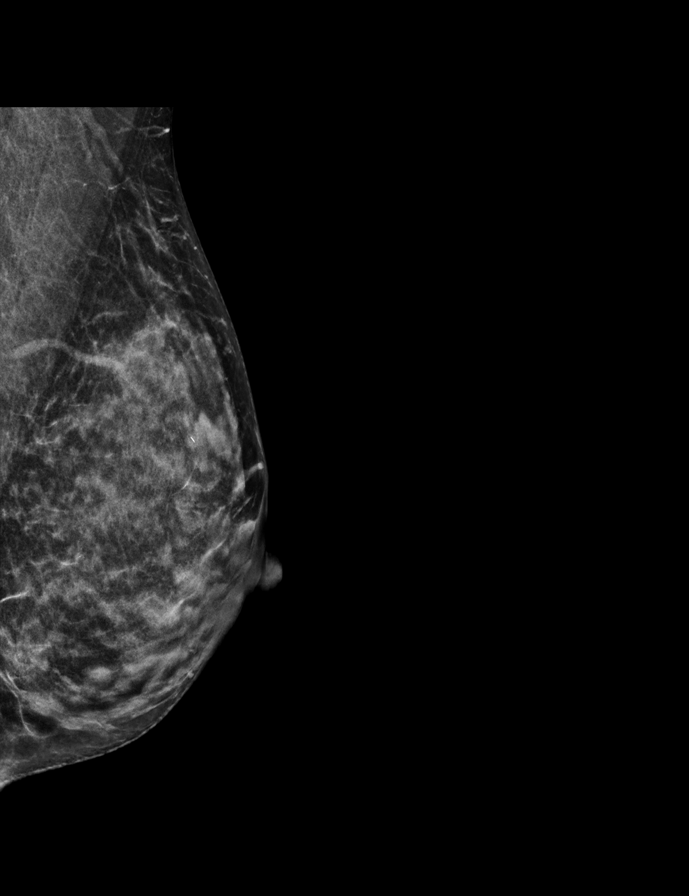

[L CC tomo · 2 of 48 frames shown]
[frame 16/48]
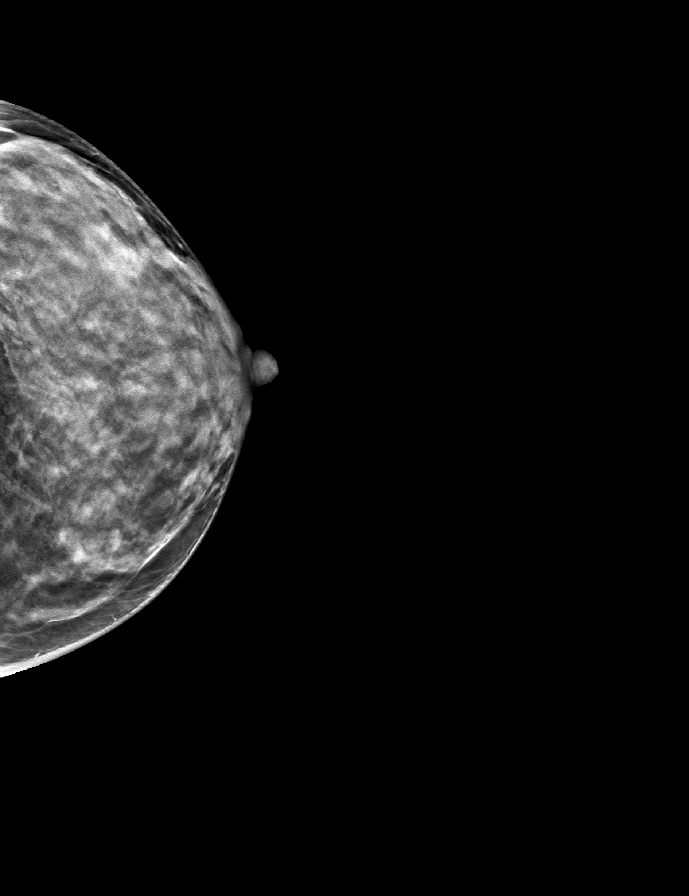
[frame 25/48]
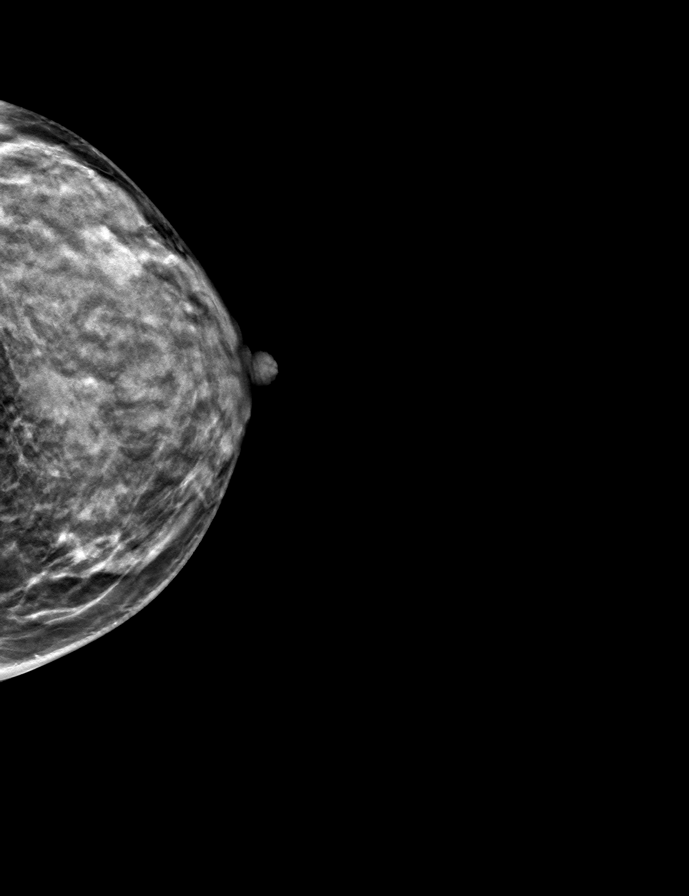

[R CC tomo · tomo slice 23/45.0]
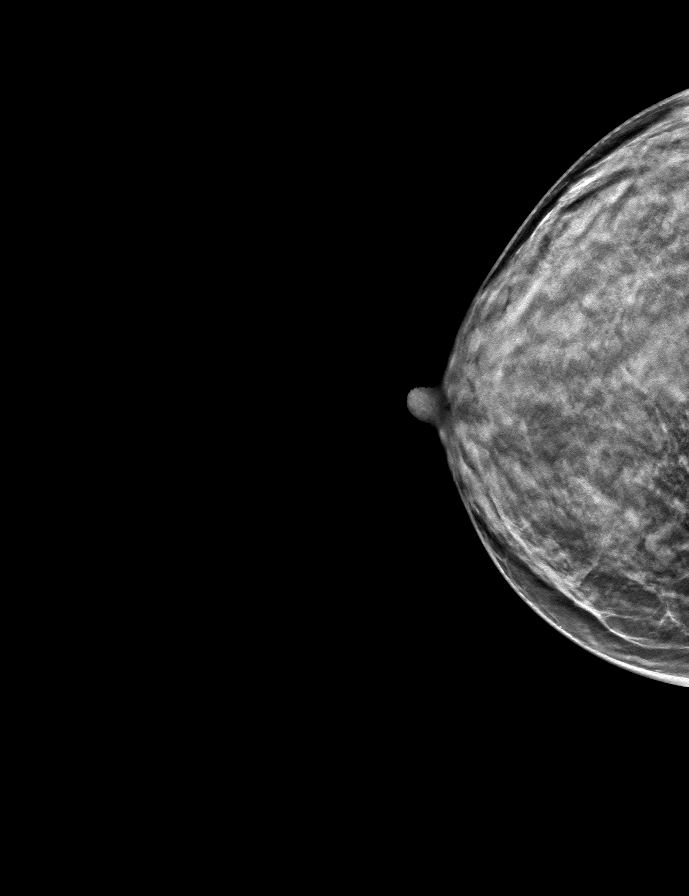

[R MLO tomo · tomo slice 23/45.0]
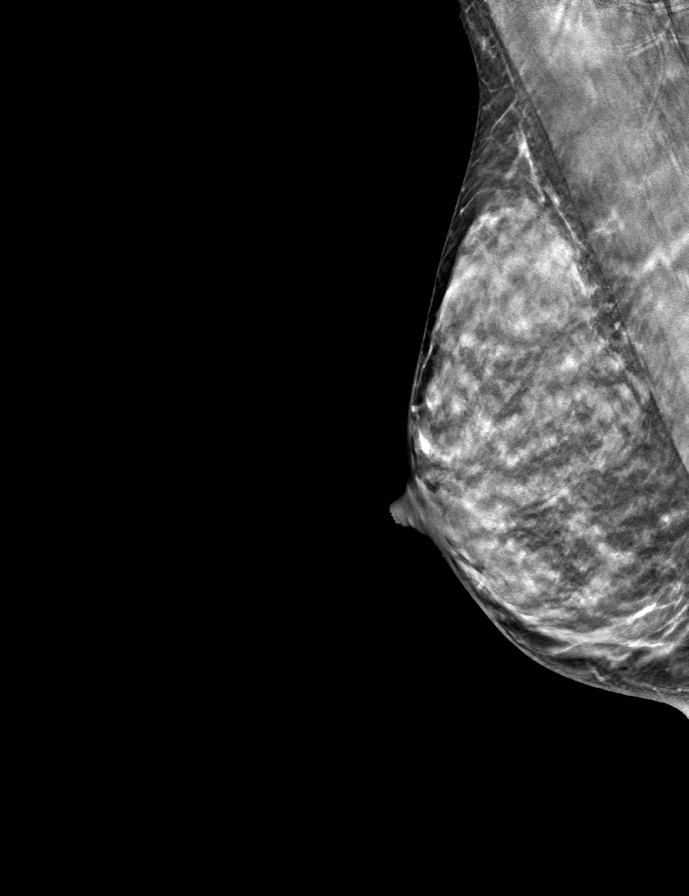

[L MLO tomo · tomo slice 24/47.0]
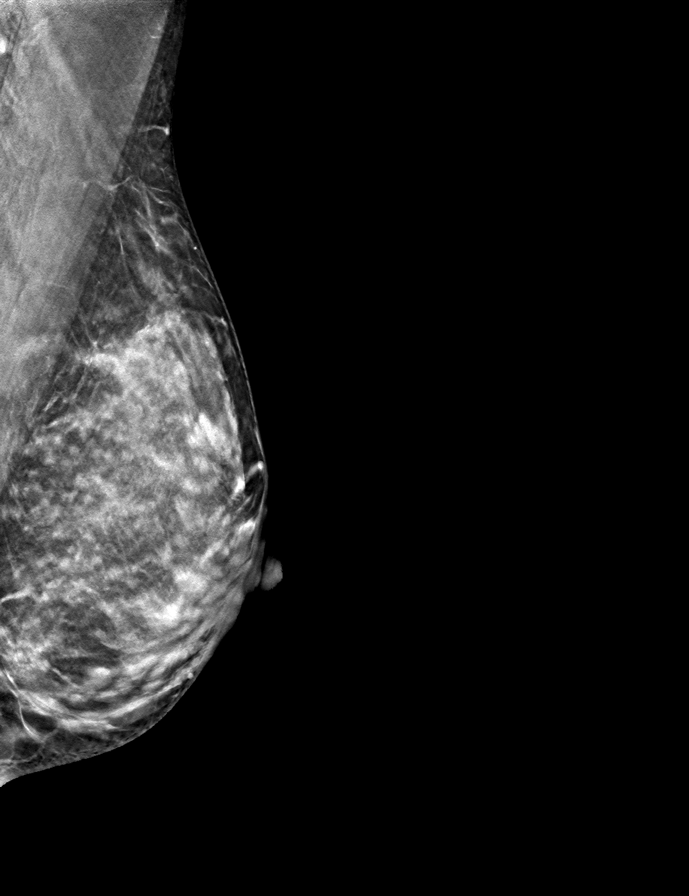

[9 of 24 positions shown; findings below may reference images not displayed]

ACR Breast Density Category d: The breast tissue is extremely dense,
which lowers the sensitivity of mammography.
FINDINGS: There are no findings suspicious for malignancy.
IMPRESSION: No mammographic evidence of malignancy. A result letter of this
screening mammogram will be mailed directly to the patient.

RECOMMENDATION:
Screening mammogram in one year. (Code:W3-Z-XIN)

BI-RADS CATEGORY  1: Negative.

## 2023-03-16 ENCOUNTER — Other Ambulatory Visit: Payer: Self-pay | Admitting: Family Medicine

## 2023-03-16 DIAGNOSIS — Z1231 Encounter for screening mammogram for malignant neoplasm of breast: Secondary | ICD-10-CM

## 2023-03-18 NOTE — Progress Notes (Signed)
  [name] [address] [place] [address] Phone: [phone] Subjective:   Patty [name], am serving as a scribe for Dr. Antoine Primas.  I'm seeing this patient by the request  of:  Scifres, Dorothy, PA-C (Inactive)  CC: Low back pain  [id number]  Patty [name] is a 47 y.o. female coming in with complaint of back and neck pain. OMT 02/05/2023.  Patient states that she is having tingling and tightness in the hips and lower back. On Prednisone for hives.   Medications patient has been prescribed: Gabapentin, Effexor  Taking:         Reviewed prior external information including notes and imaging from previsou exam, outside providers and external EMR if available.   As well as notes that were available from care everywhere and other healthcare systems.  Past medical history, social, surgical and family history all reviewed in electronic medical record.  No pertanent information unless stated regarding to the chief complaint.   Past Medical History:  Diagnosis Date   Chronic back pain    officially undiagnosed    No Known Allergies   Review of Systems:  No headache, visual changes, nausea, vomiting, diarrhea, constipation, dizziness, abdominal pain, skin rash, fevers, chills, night sweats, weight loss, swollen lymph nodes, body aches, joint swelling, chest pain, shortness of breath, mood changes. POSITIVE muscle aches  Objective  Blood pressure 110/78, pulse 82, height 5\' 2"  (1.575 m), weight 136 lb (61.7 kg), SpO2 99%.   General: No apparent distress alert and oriented x3 mood and affect normal, dressed appropriately.  HEENT: Pupils equal, extraocular movements intact  Respiratory: Patient's speak in full sentences and does not appear short of breath  Cardiovascular: No lower extremity edema, non tender, no erythema  Low back exam does have some loss of lordosis noted.  Patient does have severe tightness noted in the  paraspinal musculature.  Seems to be right greater than left.  Does have a pain level on the left sacroiliac joint which is different than usual.  Osteopathic findings  C2 flexed rotated and side bent right C6 flexed rotated and side bent left T3 extended rotated and side bent right inhaled rib T9 extended rotated and side bent left L3 flexed rotated and side bent right Sacrum left on left       Assessment and Plan:  SI (sacroiliac) joint dysfunction Will continue to work on posture and ergonomics.  Continue to work on core strengthening.  Is having an exacerbation.  Has recently had hives on her legs.  No new medications.  Will continue to monitor and is on prednisone.  Follow-up again in 6 to 8 weeks.    Nonallopathic problems  Decision today to treat with OMT was based on Physical Exam  After verbal consent patient was treated with HVLA, ME, FPR techniques in cervical, rib, thoracic, lumbar, and sacral  areas  Patient tolerated the procedure well with improvement in symptoms  Patient given exercises, stretches and lifestyle modifications  See medications in patient instructions if given  Patient will follow up in 4-8 weeks     The above documentation has been reviewed and is accurate and complete [name], DO         Note: This dictation was prepared with Dragon dictation along with smaller phrase technology. Any transcriptional errors that result from this process are unintentional.

## 2023-03-19 ENCOUNTER — Encounter: Payer: Self-pay | Admitting: Family Medicine

## 2023-03-19 ENCOUNTER — Ambulatory Visit: Payer: Commercial Managed Care - PPO | Admitting: Family Medicine

## 2023-03-19 VITALS — BP 110/78 | HR 82 | Ht 62.0 in | Wt 136.0 lb

## 2023-03-19 DIAGNOSIS — M9903 Segmental and somatic dysfunction of lumbar region: Secondary | ICD-10-CM

## 2023-03-19 DIAGNOSIS — M9901 Segmental and somatic dysfunction of cervical region: Secondary | ICD-10-CM

## 2023-03-19 DIAGNOSIS — M9908 Segmental and somatic dysfunction of rib cage: Secondary | ICD-10-CM | POA: Diagnosis not present

## 2023-03-19 DIAGNOSIS — M9904 Segmental and somatic dysfunction of sacral region: Secondary | ICD-10-CM

## 2023-03-19 DIAGNOSIS — M9902 Segmental and somatic dysfunction of thoracic region: Secondary | ICD-10-CM

## 2023-03-19 DIAGNOSIS — M533 Sacrococcygeal disorders, not elsewhere classified: Secondary | ICD-10-CM | POA: Diagnosis not present

## 2023-03-19 NOTE — Assessment & Plan Note (Signed)
Will continue to work on [employer].  Continue to work on core strengthening.  Is having an exacerbation.  Has recently had hives on her legs.  No new medications.  Will continue to monitor and is on prednisone.  Follow-up again in 6 to 8 weeks.

## 2023-03-19 NOTE — Patient Instructions (Signed)
Send message on Monday See me in 6-8 weeks

## 2023-04-16 ENCOUNTER — Ambulatory Visit: Payer: Commercial Managed Care - PPO

## 2023-04-24 ENCOUNTER — Other Ambulatory Visit: Payer: Self-pay | Admitting: Family Medicine

## 2023-04-29 NOTE — Progress Notes (Signed)
  [name] [address] [place] [address] Phone: [phone] Subjective:   Patty [name], am serving as a scribe for Dr. Antoine [name].  I'm seeing this patient by the request  of:  [name], Dorothy, PA-C (Inactive)  CC: Neck and back pain follow-up  [id number]  Patty [name] is a 47 y.o. female coming in with complaint of back and neck pain. OMT 03/19/2023. Patient states that she had rough week recently but she said her back pain has improved. Sitting and execercising can aggreavate her back pain.    Medications patient has been prescribed: Gabapentin, Effexor  Taking:         Reviewed prior external information including notes and imaging from previsou exam, outside providers and external EMR if available.   As well as notes that were available from care everywhere and other healthcare systems.  Past medical history, social, surgical and family history all reviewed in electronic medical record.  No pertanent information unless stated regarding to the chief complaint.   Past Medical History:  Diagnosis Date   Chronic back pain    officially undiagnosed    No Known Allergies   Review of Systems:  No headache, visual changes, nausea, vomiting, diarrhea, constipation, dizziness, abdominal pain, skin rash, fevers, chills, night sweats, weight loss, swollen lymph nodes, body aches, joint swelling, chest pain, shortness of breath, mood changes. POSITIVE muscle aches  Objective  Blood pressure 122/84, pulse 100, height 5\' 2"  (1.575 m), weight 141 lb (64 kg), SpO2 97%.   General: No apparent distress alert and oriented x3 mood and affect normal, dressed appropriately.  HEENT: Pupils equal, extraocular movements intact  Respiratory: Patient's speak in full sentences and does not appear short of breath  Cardiovascular: No lower extremity edema, non tender, no erythema  Gait relatively normal MSK:  Back does have some loss of  lordosis noted.  Some tenderness to palpation in the paraspinal musculature.  Tightness with sacrum right greater than left  Osteopathic findings  C2 flexed rotated and side bent right C5 flexed rotated and side bent left T3 extended rotated and side bent right inhaled rib T5 extended rotated and side bent left L2 flexed rotated and side bent right Sacrum right on right    Assessment and Plan:  SI (sacroiliac) joint dysfunction Discussed icing regimen and home exercises, discussed which activities to do and which ones to avoid.  Increase activity slowly.  Will continue to monitor the sacroiliac joint.  Patient will continue to see what else is potentially occurring with her having difficulty with hives.  Follow-up again in 6 to 8 weeks otherwise.    Nonallopathic problems  Decision today to treat with OMT was based on Physical Exam  After verbal consent patient was treated with HVLA, ME, FPR techniques in cervical, rib, thoracic, lumbar, and sacral  areas  Patient tolerated the procedure well with improvement in symptoms  Patient given exercises, stretches and lifestyle modifications  See medications in patient instructions if given  Patient will follow up in 4-8 weeks     The above documentation has been reviewed and is accurate and complete Patty Saa, DO         Note: This dictation was prepared with Dragon dictation along with smaller phrase technology. Any transcriptional errors that result from this process are unintentional.

## 2023-04-30 ENCOUNTER — Ambulatory Visit (INDEPENDENT_AMBULATORY_CARE_PROVIDER_SITE_OTHER): Payer: Commercial Managed Care - PPO | Admitting: Family Medicine

## 2023-04-30 ENCOUNTER — Encounter: Payer: Self-pay | Admitting: Family Medicine

## 2023-04-30 VITALS — BP 122/84 | HR 100 | Ht 62.0 in | Wt 141.0 lb

## 2023-04-30 DIAGNOSIS — M9904 Segmental and somatic dysfunction of sacral region: Secondary | ICD-10-CM

## 2023-04-30 DIAGNOSIS — M9908 Segmental and somatic dysfunction of rib cage: Secondary | ICD-10-CM | POA: Diagnosis not present

## 2023-04-30 DIAGNOSIS — M9901 Segmental and somatic dysfunction of cervical region: Secondary | ICD-10-CM | POA: Diagnosis not present

## 2023-04-30 DIAGNOSIS — M9903 Segmental and somatic dysfunction of lumbar region: Secondary | ICD-10-CM

## 2023-04-30 DIAGNOSIS — M533 Sacrococcygeal disorders, not elsewhere classified: Secondary | ICD-10-CM | POA: Diagnosis not present

## 2023-04-30 DIAGNOSIS — M9902 Segmental and somatic dysfunction of thoracic region: Secondary | ICD-10-CM | POA: Diagnosis not present

## 2023-04-30 NOTE — Assessment & Plan Note (Signed)
Discussed icing regimen and home exercises, discussed which activities to do and which ones to avoid.  Increase activity slowly.  Will continue to monitor the sacroiliac joint.  Patient will continue to see what else is potentially occurring with her having difficulty with hives.  Follow-up again in 6 to 8 weeks otherwise.

## 2023-04-30 NOTE — Patient Instructions (Signed)
Good to see you! Keep little eye on any exacerbations Write me if you find a patten See you again in 6 weeks

## 2023-05-28 ENCOUNTER — Ambulatory Visit
Admission: RE | Admit: 2023-05-28 | Discharge: 2023-05-28 | Disposition: A | Discharge status: Home / Self Care | Payer: Commercial Managed Care - PPO | Source: Ambulatory Visit | Attending: Family Medicine | Admitting: Family Medicine

## 2023-05-28 DIAGNOSIS — Z1231 Encounter for screening mammogram for malignant neoplasm of breast: Secondary | ICD-10-CM

## 2023-06-10 NOTE — Progress Notes (Signed)
[name] [address] [place] [address] Phone: [phone] Subjective:   [name], am serving as a scribe for Dr. Antoine Primas.  I'm seeing this patient by the request  of:  Scifres, Dorothy, PA-C (Inactive)  CC: Back and neck pain follow-up  [id number]  Patty [name] is a 47 y.o. female coming in with complaint of back and neck pain. OMT 04/30/2023. Patient states that she has been having some lower back pain on L side that is intermittent.   Medications patient has been prescribed: Gabapentin, Effexor  Taking:         Reviewed prior external information including notes and imaging from previsou exam, outside providers and external EMR if available.   As well as notes that were available from care everywhere and other healthcare systems.  Past medical history, social, surgical and family history all reviewed in electronic medical record.  No pertanent information unless stated regarding to the chief complaint.   Past Medical History:  Diagnosis Date   Chronic back pain    officially undiagnosed    No Known Allergies   Review of Systems:  No headache, visual changes, nausea, vomiting, diarrhea, constipation, dizziness, abdominal pain, skin rash, fevers, chills, night sweats, weight loss, swollen lymph nodes, body aches, joint swelling, chest pain, shortness of breath, mood changes. POSITIVE muscle aches  Objective  Blood pressure 124/86, pulse 95, height 5\' 2"  (1.575 m), weight 145 lb (65.8 kg), SpO2 97%.   General: No apparent distress alert and oriented x3 mood and affect normal, dressed appropriately.  HEENT: Pupils equal, extraocular movements intact  Respiratory: Patient's speak in full sentences and does not appear short of breath  Cardiovascular: No lower extremity edema, non tender, no erythema  Gait MSK:  Back does have some loss lordosis noted.  Some tenderness to palpation in the paraspinal  musculature.  Tightness mostly in the paraspinal musculature lumbar spine left greater than right.  Some more tightness with the left sacroiliac joint  Osteopathic findings  C2 flexed rotated and side bent right C6 flexed rotated and side bent left T3 extended rotated and side bent right inhaled rib T9 extended rotated and side bent left L2 flexed rotated and side bent right L3 flexed rotated and side bent left Sacrum left on left       Assessment and Plan:  SI (sacroiliac) joint dysfunction Sacroiliac joint does have some improvement with the medicine we will increase gabapentin to 300 mg secondary to patient having some of an exacerbation.  Will consider potentially increasing the Effexor as well if necessary.  Will continue to monitor.  Continuing to have the left-sided discomfort and pain.  Discussed potential advanced imaging which patient declined.  Follow-up again in 6 to 8 weeks otherwise.    Nonallopathic problems  Decision today to treat with OMT was based on Physical Exam  After verbal consent patient was treated with HVLA, ME, FPR techniques in cervical, rib, thoracic, lumbar, and sacral  areas  Patient tolerated the procedure well with improvement in symptoms  Patient given exercises, stretches and lifestyle modifications  See medications in patient instructions if given  Patient will follow up in 4-8 weeks     The above documentation has been reviewed and is accurate and complete [name], DO         Note: This dictation was prepared with Dragon dictation along with smaller phrase technology. Any transcriptional errors that result from this process are unintentional.

## 2023-06-11 ENCOUNTER — Encounter: Payer: Self-pay | Admitting: Family Medicine

## 2023-06-11 ENCOUNTER — Ambulatory Visit (INDEPENDENT_AMBULATORY_CARE_PROVIDER_SITE_OTHER): Payer: Commercial Managed Care - PPO | Admitting: Family Medicine

## 2023-06-11 VITALS — BP 124/86 | HR 95 | Ht 62.0 in | Wt 145.0 lb

## 2023-06-11 DIAGNOSIS — M9902 Segmental and somatic dysfunction of thoracic region: Secondary | ICD-10-CM

## 2023-06-11 DIAGNOSIS — M9901 Segmental and somatic dysfunction of cervical region: Secondary | ICD-10-CM | POA: Diagnosis not present

## 2023-06-11 DIAGNOSIS — M533 Sacrococcygeal disorders, not elsewhere classified: Secondary | ICD-10-CM | POA: Diagnosis not present

## 2023-06-11 DIAGNOSIS — M9908 Segmental and somatic dysfunction of rib cage: Secondary | ICD-10-CM | POA: Diagnosis not present

## 2023-06-11 DIAGNOSIS — M9904 Segmental and somatic dysfunction of sacral region: Secondary | ICD-10-CM | POA: Diagnosis not present

## 2023-06-11 DIAGNOSIS — M9903 Segmental and somatic dysfunction of lumbar region: Secondary | ICD-10-CM

## 2023-06-11 MED ORDER — GABAPENTIN 100 MG PO CAPS: 200.0000 mg | ORAL_CAPSULE | Freq: Every day | ORAL | 0 refills | Status: DC

## 2023-06-11 MED ORDER — GABAPENTIN 300 MG PO CAPS: 300.0000 mg | ORAL_CAPSULE | Freq: Every day | ORAL | 0 refills | Status: DC

## 2023-06-11 NOTE — Patient Instructions (Signed)
Gabapentin to 300mg  Tens unit Yoga Wheel See you again in 6-8 weeks

## 2023-06-11 NOTE — Assessment & Plan Note (Signed)
Sacroiliac joint does have some improvement with the medicine we will increase gabapentin to 300 mg secondary to patient having some of an exacerbation.  Will consider potentially increasing the Effexor as well if necessary.  Will continue to monitor.  Continuing to have the left-sided discomfort and pain.  Discussed potential advanced imaging which patient declined.  Follow-up again in 6 to 8 weeks otherwise.

## 2023-07-15 NOTE — Progress Notes (Signed)
  [name] [address] [place] [address] Phone: [phone] Subjective:   Patty [name], am serving as a scribe for Dr. Antoine Primas.  I'm seeing this patient by the request  of:  Scifres, Dorothy, PA-C (Inactive)  CC: Back and neck pain follow-up  [id number]  Patty [name] is a 47 y.o. female coming in with complaint of back and neck pain. OMT 06/11/2023. Patient states same per usual. No new concerns.  Did have an exacerbation with patient being in the house for Thanksgiving.  Had to use the muscle relaxer on a more regular basis.  Medications patient has been prescribed: Gabapentin, Effexor  Taking:         Reviewed prior external information including notes and imaging from previsou exam, outside providers and external EMR if available.   As well as notes that were available from care everywhere and other healthcare systems.  Past medical history, social, surgical and family history all reviewed in electronic medical record.  No pertanent information unless stated regarding to the chief complaint.   Past Medical History:  Diagnosis Date   Chronic back pain    officially undiagnosed    No Known Allergies   Review of Systems:  No headache, visual changes, nausea, vomiting, diarrhea, constipation, dizziness, abdominal pain, skin rash, fevers, chills, night sweats, weight loss, swollen lymph nodes, body aches, joint swelling, chest pain, shortness of breath, mood changes. POSITIVE muscle aches  Objective  Blood pressure 116/72, pulse 87, height 5\' 2"  (1.575 m), weight 144 lb (65.3 kg), SpO2 98%.   General: No apparent distress alert and oriented x3 mood and affect normal, dressed appropriately.  HEENT: Pupils equal, extraocular movements intact  Respiratory: Patient's speak in full sentences and does not appear short of breath  Cardiovascular: No lower extremity edema, non tender, no erythema  Gait MSK:  Back does  have some loss of lordosis noted.  Some tenderness to palpation in the paraspinal musculature.  Osteopathic findings  C2 flexed rotated and side bent right C6 flexed rotated and side bent left T3 extended rotated and side bent right inhaled rib T9 extended rotated and side bent left L2 flexed rotated and side bent right L5 flexed rotated and side bent left Sacrum right on right       Assessment and Plan:  SI (sacroiliac) joint dysfunction Still doing well discussed HEP  Discussed which activities to do and which one sto avoid, ? Increase in near future, use muscle relaxer at night when needed.  Can take it 2 times a day and anti-inflammatories.  Follow-up again in 6 to 8 weeks otherwise.    Nonallopathic problems  Decision today to treat with OMT was based on Physical Exam  After verbal consent patient was treated with HVLA, ME, FPR techniques in cervical, rib, thoracic, lumbar, and sacral  areas  Patient tolerated the procedure well with improvement in symptoms  Patient given exercises, stretches and lifestyle modifications  See medications in patient instructions if given  Patient will follow up in 4-8 weeks    The above documentation has been reviewed and is accurate and complete [name], DO          Note: This dictation was prepared with Dragon dictation along with smaller phrase technology. Any transcriptional errors that result from this process are unintentional.

## 2023-07-26 ENCOUNTER — Ambulatory Visit (INDEPENDENT_AMBULATORY_CARE_PROVIDER_SITE_OTHER): Payer: Commercial Managed Care - PPO | Admitting: Family Medicine

## 2023-07-26 ENCOUNTER — Encounter: Payer: Self-pay | Admitting: Family Medicine

## 2023-07-26 VITALS — BP 116/72 | HR 87 | Ht 62.0 in | Wt 144.0 lb

## 2023-07-26 DIAGNOSIS — M533 Sacrococcygeal disorders, not elsewhere classified: Secondary | ICD-10-CM | POA: Diagnosis not present

## 2023-07-26 DIAGNOSIS — M9904 Segmental and somatic dysfunction of sacral region: Secondary | ICD-10-CM

## 2023-07-26 DIAGNOSIS — M9902 Segmental and somatic dysfunction of thoracic region: Secondary | ICD-10-CM

## 2023-07-26 DIAGNOSIS — M9908 Segmental and somatic dysfunction of rib cage: Secondary | ICD-10-CM | POA: Diagnosis not present

## 2023-07-26 DIAGNOSIS — M9903 Segmental and somatic dysfunction of lumbar region: Secondary | ICD-10-CM

## 2023-07-26 DIAGNOSIS — M9901 Segmental and somatic dysfunction of cervical region: Secondary | ICD-10-CM | POA: Diagnosis not present

## 2023-07-26 NOTE — Assessment & Plan Note (Signed)
Still doing well discussed HEP  Discussed which activities to do and which one sto avoid, ? Increase in near future, use muscle relaxer at night when needed.  Can take it 2 times a day and anti-inflammatories.  Follow-up again in 6 to 8 weeks otherwise.

## 2023-09-17 ENCOUNTER — Encounter: Payer: Self-pay | Admitting: Family Medicine

## 2023-09-17 ENCOUNTER — Ambulatory Visit (INDEPENDENT_AMBULATORY_CARE_PROVIDER_SITE_OTHER): Payer: Commercial Managed Care - PPO | Admitting: Family Medicine

## 2023-09-17 VITALS — BP 108/72 | HR 90 | Ht 62.0 in | Wt 143.0 lb

## 2023-09-17 DIAGNOSIS — M9903 Segmental and somatic dysfunction of lumbar region: Secondary | ICD-10-CM | POA: Diagnosis not present

## 2023-09-17 DIAGNOSIS — M9902 Segmental and somatic dysfunction of thoracic region: Secondary | ICD-10-CM

## 2023-09-17 DIAGNOSIS — M9901 Segmental and somatic dysfunction of cervical region: Secondary | ICD-10-CM | POA: Diagnosis not present

## 2023-09-17 DIAGNOSIS — M9908 Segmental and somatic dysfunction of rib cage: Secondary | ICD-10-CM

## 2023-09-17 DIAGNOSIS — M9904 Segmental and somatic dysfunction of sacral region: Secondary | ICD-10-CM

## 2023-09-17 DIAGNOSIS — M533 Sacrococcygeal disorders, not elsewhere classified: Secondary | ICD-10-CM | POA: Diagnosis not present

## 2023-09-17 MED ORDER — PREDNISONE 20 MG PO TABS: 40.0000 mg | ORAL_TABLET | Freq: Every day | ORAL | 0 refills | Status: DC

## 2023-09-17 NOTE — Assessment & Plan Note (Signed)
Is on Effexor and doing relatively well.  Continue the same 75 mg.  Discussed which activities to do and which ones to the avoid.  Increase activity slowly.  Follow-up again in 6 to 8 weeks

## 2023-09-17 NOTE — Patient Instructions (Signed)
Prednisone 40mg  for 7 days See me again in 2 months

## 2023-09-17 NOTE — Progress Notes (Signed)
  [name] [address] [place] [address] Phone: [phone] Subjective:   Patty [name], am serving as a scribe for Dr. Antoine Primas.  I'm seeing this patient by the request  of:  Scifres, Dorothy, PA-C (Inactive)  CC: Back and neck pain follow-up  [id number]  Patty [name] is a 48 y.o. female coming in with complaint of back and neck pain. OMT 07/26/2023. Patient states same per usual.  Patient has been doing relatively good, being relatively active.  Cold weather still gets some discomfort.  Nothing no that is stopping her from activity.  Getting ready to travel to [place] in the next 2 months  Medications patient has been prescribed: Gabapentin, Effexor  Taking:         Reviewed prior external information including notes and imaging from previsou exam, outside providers and external EMR if available.   As well as notes that were available from care everywhere and other healthcare systems.  Past medical history, social, surgical and family history all reviewed in electronic medical record.  No pertanent information unless stated regarding to the chief complaint.   Past Medical History:  Diagnosis Date   Chronic back pain    officially undiagnosed    No Known Allergies   Review of Systems:  No headache, visual changes, nausea, vomiting, diarrhea, constipation, dizziness, abdominal pain, skin rash, fevers, chills, night sweats, weight loss, swollen lymph nodes, body aches, joint swelling, chest pain, shortness of breath, mood changes. POSITIVE muscle aches  Objective  Blood pressure 108/72, pulse 90, height 5\' 2"  (1.575 m), weight 143 lb (64.9 kg), SpO2 98%.   General: No apparent distress alert and oriented x3 mood and affect normal, dressed appropriately.  HEENT: Pupils equal, extraocular movements intact  Respiratory: Patient's speak in full sentences and does not appear short of breath  Cardiovascular: No lower  extremity edema, non tender, no erythema  Gait MSK:  Back does have some loss lordosis noted.  Some tenderness to palpation in the paraspinal musculature.  Tightness with Pearlean Brownie right greater than left.  Osteopathic findings  C2 flexed rotated and side bent right C6 flexed rotated and side bent left T6 extended rotated and side bent left inhaled rib L2 flexed rotated and side bent right L4 flexed rotated and side bent left Sacrum right on right Anterior ilium      Assessment and Plan:  No problem-specific Assessment & Plan notes found for this encounter.    Nonallopathic problems  Decision today to treat with OMT was based on Physical Exam  After verbal consent patient was treated with HVLA, ME, FPR techniques in cervical, rib, thoracic, lumbar, and sacral  areas  Patient tolerated the procedure well with improvement in symptoms  Patient given exercises, stretches and lifestyle modifications  See medications in patient instructions if given  Patient will follow up in 4-8 weeks    The above documentation has been reviewed and is accurate and complete [name], DO          Note: This dictation was prepared with Dragon dictation along with smaller phrase technology. Any transcriptional errors that result from this process are unintentional.

## 2023-10-01 ENCOUNTER — Other Ambulatory Visit: Payer: Self-pay | Admitting: Family Medicine

## 2023-10-24 ENCOUNTER — Other Ambulatory Visit: Payer: Self-pay | Admitting: Family Medicine

## 2023-11-11 NOTE — Progress Notes (Unsigned)
  [name] [address] [place] [address] Phone: [phone] Subjective:   [name], am serving as a scribe for Dr. Antoine Primas.  I'm seeing this patient by the request  of:  Scifres, Dorothy, PA-C (Inactive)  CC: Back and neck pain follow-up  [id number]  Patty [name] is a 48 y.o. female coming in with complaint of back and neck pain. OMT 09/17/2023. Patient states that she has some catching in lumbar spine on L side.   Medications patient has been prescribed: Effexor, Gabapentin  Taking:         Reviewed prior external information including notes and imaging from previsou exam, outside providers and external EMR if available.   As well as notes that were available from care everywhere and other healthcare systems.  Past medical history, social, surgical and family history all reviewed in electronic medical record.  No pertanent information unless stated regarding to the chief complaint.   Past Medical History:  Diagnosis Date   Chronic back pain    officially undiagnosed    No Known Allergies   Review of Systems:  No headache, visual changes, nausea, vomiting, diarrhea, constipation, dizziness, abdominal pain, skin rash, fevers, chills, night sweats, weight loss, swollen lymph nodes, body aches, joint swelling, chest pain, shortness of breath, mood changes. POSITIVE muscle aches  Objective  Blood pressure 128/88, pulse 74, height 5\' 2"  (1.575 m), SpO2 98%.   General: No apparent distress alert and oriented x3 mood and affect normal, dressed appropriately.  HEENT: Pupils equal, extraocular movements intact  Respiratory: Patient's speak in full sentences and does not appear short of breath  Cardiovascular: No lower extremity edema, non tender, no erythema    Osteopathic findings  C2 flexed rotated and side bent right C6 flexed rotated and side bent left T3 extended rotated and side bent right inhaled rib T9  extended rotated and side bent left L2 flexed rotated and side bent right L3 flexed rotated and side bent left Sacrum left on left       Assessment and Plan:  SI (sacroiliac) joint dysfunction Sacroiliac dysfunction still noted.  The still having some difficulty with the left sacroiliac joint.  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  Increase activity slowly.  Discussed icing regimen.  Follow-up again in 6 to 8 weeks.    Nonallopathic problems  Decision today to treat with OMT was based on Physical Exam  After verbal consent patient was treated with HVLA, ME, FPR techniques in cervical, rib, thoracic, lumbar, and sacral  areas  Patient tolerated the procedure well with improvement in symptoms  Patient given exercises, stretches and lifestyle modifications  See medications in patient instructions if given  Patient will follow up in 4-8 weeks    The above documentation has been reviewed and is accurate and complete [name], DO          Note: This dictation was prepared with Dragon dictation along with smaller phrase technology. Any transcriptional errors that result from this process are unintentional.

## 2023-11-12 ENCOUNTER — Ambulatory Visit: Payer: Commercial Managed Care - PPO | Admitting: Family Medicine

## 2023-11-12 ENCOUNTER — Encounter: Payer: Self-pay | Admitting: Family Medicine

## 2023-11-12 VITALS — BP 128/88 | HR 74 | Ht 62.0 in

## 2023-11-12 DIAGNOSIS — M9903 Segmental and somatic dysfunction of lumbar region: Secondary | ICD-10-CM

## 2023-11-12 DIAGNOSIS — M9908 Segmental and somatic dysfunction of rib cage: Secondary | ICD-10-CM | POA: Diagnosis not present

## 2023-11-12 DIAGNOSIS — M9901 Segmental and somatic dysfunction of cervical region: Secondary | ICD-10-CM

## 2023-11-12 DIAGNOSIS — M533 Sacrococcygeal disorders, not elsewhere classified: Secondary | ICD-10-CM | POA: Diagnosis not present

## 2023-11-12 DIAGNOSIS — M9904 Segmental and somatic dysfunction of sacral region: Secondary | ICD-10-CM | POA: Diagnosis not present

## 2023-11-12 DIAGNOSIS — M9902 Segmental and somatic dysfunction of thoracic region: Secondary | ICD-10-CM

## 2023-11-12 NOTE — Patient Instructions (Signed)
 Love pure barre See me in 6-8 weeks

## 2023-11-12 NOTE — Assessment & Plan Note (Signed)
 Sacroiliac dysfunction still noted.  The still having some difficulty with the left sacroiliac joint.  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  Increase activity slowly.  Discussed icing regimen.  Follow-up again in 6 to 8 weeks.

## 2023-12-23 DIAGNOSIS — I639 Cerebral infarction, unspecified: Secondary | ICD-10-CM

## 2023-12-23 HISTORY — DX: Cerebral infarction, unspecified: I63.9

## 2023-12-31 ENCOUNTER — Other Ambulatory Visit: Payer: Self-pay | Admitting: Family Medicine

## 2023-12-31 MED ORDER — GABAPENTIN 300 MG PO CAPS: 300.0000 mg | ORAL_CAPSULE | Freq: Every evening | ORAL | 0 refills | Status: DC | PRN

## 2023-12-31 NOTE — Telephone Encounter (Signed)
 Last OV 11/12/23 Next OV not scheduled  Last refill 10/01/23 Qty #90/0

## 2024-01-02 ENCOUNTER — Emergency Department (HOSPITAL_COMMUNITY)

## 2024-01-02 ENCOUNTER — Encounter (HOSPITAL_COMMUNITY): Payer: Self-pay

## 2024-01-02 ENCOUNTER — Inpatient Hospital Stay (HOSPITAL_COMMUNITY)
Admission: EM | Admit: 2024-01-02 | Discharge: 2024-01-04 | DRG: 061 | Disposition: A | Discharge status: Home / Self Care | Attending: Neurology | Admitting: Neurology

## 2024-01-02 ENCOUNTER — Other Ambulatory Visit: Payer: Self-pay

## 2024-01-02 ENCOUNTER — Inpatient Hospital Stay (HOSPITAL_COMMUNITY)

## 2024-01-02 DIAGNOSIS — M542 Cervicalgia: Secondary | ICD-10-CM | POA: Diagnosis present

## 2024-01-02 DIAGNOSIS — E785 Hyperlipidemia, unspecified: Secondary | ICD-10-CM | POA: Diagnosis present

## 2024-01-02 DIAGNOSIS — Z79899 Other long term (current) drug therapy: Secondary | ICD-10-CM | POA: Diagnosis not present

## 2024-01-02 DIAGNOSIS — Z7902 Long term (current) use of antithrombotics/antiplatelets: Secondary | ICD-10-CM | POA: Diagnosis not present

## 2024-01-02 DIAGNOSIS — M545 Low back pain, unspecified: Secondary | ICD-10-CM | POA: Diagnosis present

## 2024-01-02 DIAGNOSIS — Z7982 Long term (current) use of aspirin: Secondary | ICD-10-CM

## 2024-01-02 DIAGNOSIS — G8929 Other chronic pain: Secondary | ICD-10-CM | POA: Diagnosis present

## 2024-01-02 DIAGNOSIS — I63512 Cerebral infarction due to unspecified occlusion or stenosis of left middle cerebral artery: Secondary | ICD-10-CM | POA: Diagnosis not present

## 2024-01-02 DIAGNOSIS — M533 Sacrococcygeal disorders, not elsewhere classified: Secondary | ICD-10-CM | POA: Diagnosis present

## 2024-01-02 DIAGNOSIS — R09A2 Foreign body sensation, throat: Secondary | ICD-10-CM | POA: Diagnosis present

## 2024-01-02 DIAGNOSIS — Z791 Long term (current) use of non-steroidal anti-inflammatories (NSAID): Secondary | ICD-10-CM | POA: Diagnosis not present

## 2024-01-02 DIAGNOSIS — N923 Ovulation bleeding: Secondary | ICD-10-CM | POA: Diagnosis present

## 2024-01-02 DIAGNOSIS — I6389 Other cerebral infarction: Secondary | ICD-10-CM | POA: Diagnosis not present

## 2024-01-02 DIAGNOSIS — I10 Essential (primary) hypertension: Secondary | ICD-10-CM | POA: Diagnosis present

## 2024-01-02 DIAGNOSIS — R4701 Aphasia: Secondary | ICD-10-CM | POA: Diagnosis present

## 2024-01-02 DIAGNOSIS — I7771 Dissection of carotid artery: Secondary | ICD-10-CM | POA: Diagnosis present

## 2024-01-02 DIAGNOSIS — R479 Unspecified speech disturbances: Secondary | ICD-10-CM | POA: Diagnosis present

## 2024-01-02 DIAGNOSIS — R29700 NIHSS score 0: Secondary | ICD-10-CM | POA: Diagnosis present

## 2024-01-02 DIAGNOSIS — I639 Cerebral infarction, unspecified: Principal | ICD-10-CM

## 2024-01-02 LAB — I-STAT CHEM 8, ED
BUN: 11 mg/dL (ref 6–20)
Calcium, Ion: 1.06 mmol/L — ABNORMAL LOW (ref 1.15–1.40)
Chloride: 108 mmol/L (ref 98–111)
Creatinine, Ser: 0.9 mg/dL (ref 0.44–1.00)
Glucose, Bld: 107 mg/dL — ABNORMAL HIGH (ref 70–99)
HCT: 42 % (ref 36.0–46.0)
Hemoglobin: 14.3 g/dL (ref 12.0–15.0)
Potassium: 3.7 mmol/L (ref 3.5–5.1)
Sodium: 140 mmol/L (ref 135–145)
TCO2: 20 mmol/L — ABNORMAL LOW (ref 22–32)

## 2024-01-02 LAB — COMPREHENSIVE METABOLIC PANEL WITH GFR
ALT: 12 U/L (ref 0–44)
AST: 21 U/L (ref 15–41)
Albumin: 4 g/dL (ref 3.5–5.0)
Alkaline Phosphatase: 35 U/L — ABNORMAL LOW (ref 38–126)
Anion gap: 12 (ref 5–15)
BUN: 8 mg/dL (ref 6–20)
CO2: 21 mmol/L — ABNORMAL LOW (ref 22–32)
Calcium: 9.6 mg/dL (ref 8.9–10.3)
Chloride: 107 mmol/L (ref 98–111)
Creatinine, Ser: 0.88 mg/dL (ref 0.44–1.00)
GFR, Estimated: >60 mL/min (ref >60)
Glucose, Bld: 110 mg/dL — ABNORMAL HIGH (ref 70–99)
Potassium: 3.7 mmol/L (ref 3.5–5.1)
Sodium: 140 mmol/L (ref 135–145)
Total Bilirubin: 0.9 mg/dL (ref 0.0–1.2)
Total Protein: 7.2 g/dL (ref 6.5–8.1)

## 2024-01-02 LAB — DIFFERENTIAL
Abs Immature Granulocytes: 0.01 10*3/uL (ref 0.00–0.07)
Basophils Absolute: 0.1 10*3/uL (ref 0.0–0.1)
Basophils Relative: 1 %
Eosinophils Absolute: 0.1 10*3/uL (ref 0.0–0.5)
Eosinophils Relative: 2 %
Immature Granulocytes: 0 %
Lymphocytes Relative: 31 %
Lymphs Abs: 1.8 10*3/uL (ref 0.7–4.0)
Monocytes Absolute: 0.6 10*3/uL (ref 0.1–1.0)
Monocytes Relative: 10 %
Neutro Abs: 3.3 10*3/uL (ref 1.7–7.7)
Neutrophils Relative %: 56 %

## 2024-01-02 LAB — ETHANOL: Alcohol, Ethyl (B): <15 mg/dL (ref <15)

## 2024-01-02 LAB — CBC
HCT: 43.5 % (ref 36.0–46.0)
Hemoglobin: 14.2 g/dL (ref 12.0–15.0)
MCH: 28.2 pg (ref 26.0–34.0)
MCHC: 32.6 g/dL (ref 30.0–36.0)
MCV: 86.3 fL (ref 80.0–100.0)
Platelets: 303 10*3/uL (ref 150–400)
RBC: 5.04 MIL/uL (ref 3.87–5.11)
RDW: 13.3 % (ref 11.5–15.5)
WBC: 5.9 10*3/uL (ref 4.0–10.5)
nRBC: 0 % (ref 0.0–0.2)

## 2024-01-02 LAB — CBG MONITORING, ED: Glucose-Capillary: 111 mg/dL — ABNORMAL HIGH (ref 70–99)

## 2024-01-02 LAB — PROTIME-INR
INR: 0.9 (ref 0.8–1.2)
Prothrombin Time: 12.6 s (ref 11.4–15.2)

## 2024-01-02 LAB — MRSA NEXT GEN BY PCR, NASAL: MRSA by PCR Next Gen: NOT DETECTED

## 2024-01-02 LAB — HCG, SERUM, QUALITATIVE: Preg, Serum: NEGATIVE

## 2024-01-02 LAB — SARS CORONAVIRUS 2 BY RT PCR: SARS Coronavirus 2 by RT PCR: NEGATIVE

## 2024-01-02 LAB — APTT: aPTT: 26 s (ref 24–36)

## 2024-01-02 MED ORDER — STROKE: EARLY STAGES OF RECOVERY BOOK
Freq: Once | Status: AC
  Filled 2024-01-02: qty 1

## 2024-01-02 MED ORDER — ACETAMINOPHEN 650 MG RE SUPP: 650.0000 mg | RECTAL | Status: DC | PRN

## 2024-01-02 MED ORDER — TENECTEPLASE FOR STROKE
0.2500 mg/kg | PACK | Freq: Once | INTRAVENOUS | Status: AC
  Administered 2024-01-02: 17 mg via INTRAVENOUS
  Filled 2024-01-02: qty 10

## 2024-01-02 MED ORDER — CLEVIDIPINE BUTYRATE 0.5 MG/ML IV EMUL: 0.0000 mg/h | INTRAVENOUS | Status: DC

## 2024-01-02 MED ORDER — SODIUM CHLORIDE 23.4 % INJECTION (4 MEQ/ML) FOR IV ADMINISTRATION
120.0000 meq | Freq: Once | INTRAVENOUS | Status: DC
  Filled 2024-01-02: qty 30

## 2024-01-02 MED ORDER — SODIUM CHLORIDE 0.9 % IV SOLN: INTRAVENOUS | Status: DC

## 2024-01-02 MED ORDER — IOHEXOL 350 MG/ML SOLN
75.0000 mL | Freq: Once | INTRAVENOUS | Status: AC | PRN
  Administered 2024-01-02: 75 mL via INTRAVENOUS

## 2024-01-02 MED ORDER — SODIUM CHLORIDE 3 % IV SOLN: INTRAVENOUS | Status: DC

## 2024-01-02 MED ORDER — ACETAMINOPHEN 325 MG PO TABS
650.0000 mg | ORAL_TABLET | ORAL | Status: DC | PRN
  Administered 2024-01-02 – 2024-01-04 (×4): 650 mg via ORAL
  Filled 2024-01-02 (×4): qty 2

## 2024-01-02 MED ORDER — SODIUM CHLORIDE 0.9% FLUSH: 3.0000 mL | Freq: Once | INTRAVENOUS | Status: DC

## 2024-01-02 MED ORDER — CHLORHEXIDINE GLUCONATE CLOTH 2 % EX PADS: 6.0000 | MEDICATED_PAD | Freq: Every day | CUTANEOUS | Status: DC

## 2024-01-02 MED ORDER — ASPIRIN 325 MG PO TABS: 325.0000 mg | ORAL_TABLET | Freq: Once | ORAL | Status: DC

## 2024-01-02 MED ORDER — ACETAMINOPHEN 160 MG/5ML PO SOLN: 650.0000 mg | ORAL | Status: DC | PRN

## 2024-01-02 MED ORDER — SENNOSIDES-DOCUSATE SODIUM 8.6-50 MG PO TABS: 1.0000 | ORAL_TABLET | Freq: Every evening | ORAL | Status: DC | PRN

## 2024-01-02 MED ORDER — ASPIRIN 325 MG PO TABS
325.0000 mg | ORAL_TABLET | Freq: Every day | ORAL | Status: DC
  Administered 2024-01-02: 325 mg via ORAL
  Filled 2024-01-02: qty 1

## 2024-01-02 MED ORDER — PANTOPRAZOLE SODIUM 40 MG IV SOLR
40.0000 mg | Freq: Every day | INTRAVENOUS | Status: DC
  Administered 2024-01-02: 40 mg via INTRAVENOUS
  Filled 2024-01-02: qty 10

## 2024-01-02 MED ORDER — LACTATED RINGERS IV BOLUS
1000.0000 mL | Freq: Once | INTRAVENOUS | Status: AC
  Administered 2024-01-02: 1000 mL via INTRAVENOUS

## 2024-01-02 MED ORDER — LORAZEPAM 2 MG/ML IJ SOLN
1.0000 mg | INTRAMUSCULAR | Status: DC | PRN
  Administered 2024-01-02: 1 mg via INTRAVENOUS
  Filled 2024-01-02: qty 1

## 2024-01-02 MED ORDER — ORAL CARE MOUTH RINSE: 15.0000 mL | OROMUCOSAL | Status: DC | PRN

## 2024-01-02 MED ORDER — CHLORHEXIDINE GLUCONATE CLOTH 2 % EX PADS
6.0000 | MEDICATED_PAD | Freq: Every day | CUTANEOUS | Status: DC
  Administered 2024-01-02 – 2024-01-04 (×3): 6 via TOPICAL

## 2024-01-02 NOTE — H&P (Signed)
 Please see my consult note from earlier today for full H&P.

## 2024-01-02 NOTE — Progress Notes (Signed)
 PHARMACIST CODE STROKE RESPONSE  Called back to code stroke by neurologist after MRI completed for TNK administration.  Notified to mix TNK at 1823 by Dr. Murvin Arthurs TNK preparation completed at 1826  TNK dose = 17 mg IV over 5 seconds  Issues/delays encountered (if applicable): n/a  Shaun Deiters 01/02/24 6:29 PM

## 2024-01-02 NOTE — Consult Note (Addendum)
 NEUROLOGY CONSULT NOTE   Date of service: Jan 02, 2024 Patient Name: Patty [name] MRN:  [id number] DOB:  05-Oct-1975 Chief Complaint: "concern for aphasia" Requesting Provider: Jerilynn Montenegro, MD  History of Present Illness  Patty [name] is a 48 y.o. female with hx of chronic back pain, sacroiliac pain who presents with aphemia. LKW 1600. She was at home having a normal day. She walked into the room where her husband was and was tearful and sobbing. Repetitive swallowing. Husband attempted stroke scale and could not get her to speak. Called 911. EMS arrived. Patient had no other deficit. EMS were on the scene for 20 misn with no significant speech improvement. She was standing up, able to walk by herself and had no other deficit.  She was activated and brought in as a code stroke. Some improvement enroute and able to speak in a short phrase or single word.  On my evaluation, slight hyperventilation. Appears tearful. She is able to mouth words initially, able to speak a few words.  She is able to name thumb, pinky finger, glove, identify colors, able to identify objects and aphasia pictures. She is oriented to her age and month and year. NIHSS is a 0 despite very limited speech output.  She was initially not offered tnkase as her symptoms were felt to be more concerning for globus sensation in the setting of a potential panic attack. However, husband mentioned that this is very unlike her and she has never had this in the past in the setting of a panic attack. Patient noted to be able to write without any difficulties. She was taken for a STAT MRI Brain then which demonstrated diffusion restriction in the left frontal operculum concerning for an acute stroke. Despite her NIHSS of 0, significantly limited speech output secondary to aphemia was felt to be disabling and she was offered tnkase. I had extensive discussion with patient and her husband about tnkase including 30% chance of  improvement and about 3-5% risk of ICH which can worsen her symptoms and even result in death. Patient and family consented to tnkase. Pharmacy mixed tnkase and it was given to patient at 1826.  Patient was taken for STAT CT Angio of the head and neck which was notable for irregularity and severe narrowing of the mid and distal left ICA concerning for a dissection with approximately 1 to 2 mm segment occlusion of the distal left cer focal ICA and then focal occlusion of left petrous ICA with reconstitution and good flow in the distal left cavernous and supraclinoid ICA.  Rest of the arterial vasculature including the left MCA branches are intact with no occlusion. CT perfusion with no mismatch.  Upon further history from husband, patient had been reporting a whooshing sound in her left ear for the last 2 to 3 days.  Patient also reports a recent visit to hair salon with her head hyperextended onto the sink for an extended period of time.  Also reports a visit to chiropractor a couple weeks ago but did not have any chiropractic neck manipulation.  LKW: 1600 Modified rankin score: 0-Completely asymptomatic and back to baseline post- stroke IV Thrombolysis: initially not offered, as symptoms felt to be less concerning for stroke.  EVT: not offered, no mismatch on CT perfusion.  NIHSS components Score: Comment  1a Level of Conscious 0[x]  1[]  2[]  3[]      1b LOC Questions 0[x]  1[]  2[]       1c LOC Commands 0[x]  1[]  2[]   2 Best Gaze 0[x]  1[]  2[]       3 Visual 0[x]  1[]  2[]  3[]      4 Facial Palsy 0[x]  1[]  2[]  3[]      5a Motor Arm - left 0[x]  1[]  2[]  3[]  4[]  UN[]    5b Motor Arm - Right 0[x]  1[]  2[]  3[]  4[]  UN[]    6a Motor Leg - Left 0[x]  1[]  2[]  3[]  4[]  UN[]    6b Motor Leg - Right 0[x]  1[]  2[]  3[]  4[]  UN[]    7 Limb Ataxia 0[x]  1[]  2[]  UN[]      8 Sensory 0[x]  1[]  2[]  UN[]      9 Best Language 0[x]  1[]  2[]  3[]      10 Dysarthria 0[x]  1[]  2[]  UN[]      11 Extinct. and Inattention 0[x]  1[]  2[]        TOTAL: 0      ROS  Unable to ascertain due to decreased speech output.  Past History   Past Medical History:  Diagnosis Date   Chronic back pain    officially undiagnosed    History reviewed. No pertinent surgical history.  Family History: History reviewed. No pertinent family history.  Social History  reports that she has never smoked. She has never used smokeless tobacco. She reports that she does not drink alcohol and does not use drugs.  No Known Allergies  Medications   Current Facility-Administered Medications:    LORazepam (ATIVAN) injection 1 mg, 1 mg, Intravenous, Q15 min PRN, Mahathi Pokorney, MD, 1 mg at 01/02/24 1741   sodium chloride flush (NS) 0.9 % injection 3 mL, 3 mL, Intravenous, Once, Jerilynn Montenegro, MD  Current Outpatient Medications:    cyclobenzaprine  (FLEXERIL ) 10 MG tablet, Take 10 mg by mouth 3 (three) times daily as needed for muscle spasms. , Disp: , Rfl:    cyclobenzaprine  (FLEXERIL ) 5 MG tablet, Take 1-2 tablets (5-10 mg total) by mouth 3 (three) times daily as needed for muscle spasms., Disp: 10 tablet, Rfl: 3   diazepam  (VALIUM ) 5 MG tablet, Take 1 tablet (5 mg total) by mouth 2 (two) times daily., Disp: 15 tablet, Rfl: 0   gabapentin  (NEURONTIN ) 100 MG capsule, Take 2 capsules (200 mg total) by mouth at bedtime., Disp: 180 capsule, Rfl: 0   gabapentin  (NEURONTIN ) 300 MG capsule, Take 1 capsule (300 mg total) by mouth at bedtime as needed., Disp: 90 capsule, Rfl: 0   HYDROcodone -acetaminophen  (NORCO) 5-325 MG tablet, Take 1-2 tablets by mouth daily as needed., Disp: 10 tablet, Rfl: 0   HYDROcodone -acetaminophen  (NORCO/VICODIN) 5-325 MG per tablet, Take 1 tablet by mouth once., Disp: , Rfl:    ibuprofen (ADVIL,MOTRIN) 200 MG tablet, Take 600 mg by mouth every 6 (six) hours as needed for headache, mild pain or moderate pain., Disp: , Rfl:    meloxicam  (MOBIC ) 15 MG tablet, Take 1 tablet (15 mg total) by mouth daily., Disp: 30 tablet, Rfl: 0    meloxicam  (MOBIC ) 7.5 MG tablet, Take 1 tablet (7.5 mg total) by mouth 2 (two) times daily., Disp: 30 tablet, Rfl: 0   methocarbamol  (ROBAXIN ) 500 MG tablet, Take 1 tablet (500 mg total) by mouth every 6 (six) hours as needed for muscle spasms., Disp: 30 tablet, Rfl: 2   methocarbamol  (ROBAXIN ) 500 MG tablet, TAKE 1 TABLET (500 MG TOTAL) BY MOUTH EVERY 6 (SIX) HOURS AS NEEDED FOR MUSCLE SPASMS., Disp: 10 tablet, Rfl: 2   methocarbamol  (ROBAXIN ) 500 MG tablet, Take 1 tablet (500 mg total) by mouth 4 (four) times daily., Disp: 30 tablet, Rfl: 0  methocarbamol  (ROBAXIN ) 500 MG tablet, Take 1 tablet (500 mg total) by mouth every 8 (eight) hours as needed for muscle spasms., Disp: 30 tablet, Rfl: 1   oxyCODONE -acetaminophen  (PERCOCET/ROXICET) 5-325 MG per tablet, Take 1-2 tablets by mouth every 6 (six) hours as needed for severe pain., Disp: 25 tablet, Rfl: 0   predniSONE  (DELTASONE ) 10 MG tablet, Take 2 tablets (20 mg total) by mouth daily., Disp: 21 tablet, Rfl: 0   predniSONE  (DELTASONE ) 20 MG tablet, Take 2 tablets (40 mg total) by mouth daily with breakfast., Disp: 10 tablet, Rfl: 0   predniSONE  (DELTASONE ) 20 MG tablet, Take 2 tablets (40 mg total) by mouth daily with breakfast., Disp: 14 tablet, Rfl: 0   predniSONE  (STERAPRED UNI-PAK 21 TAB) 10 MG (21) TBPK tablet, Take as directed, Disp: 21 tablet, Rfl: 0   predniSONE  (STERAPRED UNI-PAK 21 TAB) 10 MG (21) TBPK tablet, Take as directed, Disp: 21 tablet, Rfl: 0   predniSONE  (STERAPRED UNI-PAK 21 TAB) 10 MG (21) TBPK tablet, Take as directed, Disp: 21 tablet, Rfl: 0   SRONYX 0.1-20 MG-MCG tablet, Take 1 tablet by mouth daily., Disp: , Rfl: 6   venlafaxine  XR (EFFEXOR -XR) 37.5 MG 24 hr capsule, TAKE 1 CAPSULE BY MOUTH DAILY WITH BREAKFAST., Disp: 90 capsule, Rfl: 1  Vitals   Vitals:   01/02/24 1713 01/02/24 1730 01/02/24 1740 01/02/24 1746  BP:  (!) 168/85 (!) 161/90   Pulse:  74 70   Resp:   14   Temp:    98.9 F (37.2 C)  TempSrc:    Oral   SpO2:   100%   Weight: 67 kg       Body mass index is 27.02 kg/m.  Physical Exam   General: Laying comfortably in bed; in no acute distress.  HENT: Normal oropharynx and mucosa. Normal external appearance of ears and nose.  Neck: Supple, no pain or tenderness  CV: No JVD. No peripheral edema.  Pulmonary: Symmetric Chest rise. Normal respiratory effort.  Abdomen: Soft to touch, non-tender.  Ext: No cyanosis, edema, or deformity  Skin: No rash. Normal palpation of skin.   Musculoskeletal: Normal digits and nails by inspection. No clubbing.   Neurologic Examination  Mental status/Cognition: Alert, oriented to self, place, month and year, good attention.  Speech/language: non fluent(only speaks in single word of short phrase), no dysarthria, comprehension intact, object naming intact. Cranial nerves:   CN II Pupils equal and reactive to light, no VF deficits    CN III,IV,VI EOM intact, no gaze preference or deviation, no nystagmus    CN V normal sensation in V1, V2, and V3 segments bilaterally    CN VII no asymmetry, no nasolabial fold flattening    CN VIII normal hearing to speech    CN IX & X normal palatal elevation, no uvular deviation    CN XI 5/5 head turn and 5/5 shoulder shrug bilaterally    CN XII midline tongue protrusion    Motor:  Muscle bulk: normal, tone normal, pronator drift none tremor none Mvmt Root Nerve  Muscle Right Left Comments  SA C5/6 Ax Deltoid 5 5   EF C5/6 Mc Biceps 5 5   EE C6/7/8 Rad Triceps 5 5   WF C6/7 Med FCR     WE C7/8 PIN ECU     F Ab C8/T1 U ADM/FDI 5 5   HF L1/2/3 Fem Illopsoas 5 5   KE L2/3/4 Fem Quad 5 5   DF L4/5 D Peron Tib  Ant 5 5   PF S1/2 Tibial Grc/Sol 5 5    Sensation:  Light touch Intact throughout   Pin prick    Temperature    Vibration   Proprioception    Coordination/Complex Motor:  - Finger to Nose intact BL - Heel to shin intact BL - Rapid alternating movement are normal - Gait:  deferred.  Labs/Imaging/Neurodiagnostic studies   CBC:  Recent Labs  Lab 2024-01-27 1710 January 27, 2024 1712  WBC 5.9  --   NEUTROABS 3.3  --   HGB 14.2 14.3  HCT 43.5 42.0  MCV 86.3  --   PLT 303  --    Basic Metabolic Panel:  Lab Results  Component Value Date   NA 140 January 27, 2024   K 3.7 01-27-24   CO2 21 (L) 2024/01/27   GLUCOSE 107 (H) 01/27/2024   BUN 11 01/27/24   CREATININE 0.90 01/27/24   CALCIUM 9.6 January 27, 2024   GFRNONAA >60 Jan 27, 2024   GFRAA >60 02/11/2015   Lipid Panel: No results found for: "LDLCALC" HgbA1c: No results found for: "HGBA1C" Urine Drug Screen: No results found for: "LABOPIA", "COCAINSCRNUR", "LABBENZ", "AMPHETMU", "THCU", "LABBARB"  Alcohol Level     Component Value Date/Time   ETH <15 Jan 27, 2024 1710   INR  Lab Results  Component Value Date   INR 0.9 2024/01/27   APTT  Lab Results  Component Value Date   APTT 26 January 27, 2024   AED levels: No results found for: "PHENYTOIN", "ZONISAMIDE", "LAMOTRIGINE", "LEVETIRACETA"  CT Head without contrast(Personally reviewed): CTH was negative for a large hypodensity concerning for a large territory infarct or hyperdensity concerning for an ICH  CT angio Head and Neck with contrast(Personally reviewed): Irregularity and severe narrowing of the mid and distal left cervical ICA concerning for dissection. There is a short approximately 1-2 mm segment of occlusion of the distal left cervical ICA with reconstitution proximal to the skull base.   Additional significant irregularity and likely focal occlusion at the posterior vertical portion of the left petrous ICA. Reconstitution of the horizontal left petrous ICA which is diminutive in caliber and irregularity concerning for intracranial propagation of dissection.   Diminished intraluminal contrast within the left cavernous and left supraclinoid ICA without significant irregularity.   Arterial vasculature in the head and neck is otherwise  patent.   No core infarct identified on CT perfusion.  MRI Brain(Personally reviewed): Focal acute infarct involving cortex within the left frontal operculum.  ASSESSMENT   Patty [name] is a 48 y.o. female with hx of chronic back pain, sacroiliac pain who presents with aphemia.  On evaluation, she able to speak in single word or short phrases.  However, significantly reduced speech fluency.  Patient is able to read and write without any difficulty.  Her NIH stroke scale was a 0.  Given patient was hyperventilating and tearful at presentation, initially her symptoms were felt to be globus sensation(cricopharyngeal spasm) secondary to panic attack and therefore, initially she was not offered TNKase.  However, when her husband arrived she reported she has never had these symptoms in the past with the panic attacks.  Patient was taken for stat MRI of the brain without contrast which demonstrated left frontal operculum diffusion restriction concerning for an acute infarct.  Despite her NIH stroke scale being a 0, TNKase was offered as her symptoms were felt to be significantly disabling given how limited her speech output was.  Both patient and husband understand the 3 to 5% risk of ICH which can worsen  her symptoms or even result in death.  They consented to TNKase administration.  There was delay to TNKase administration as initially, her symptoms were thought to be secondary to a panic attack.  Patient had a CT angio of the head and neck which demonstrated left ICA dissection at the level of the mid cervical ICA with focal occlusion of distal left cervical and petrous ICA with reconstitution through the cavernous ICA and the supraclinoid ICA.  CT perfusion did not demonstrate a core or mismatch.  I suspect that her symptoms are likely secondary to a embolization to the left MCA territory of a thrombus that probably's formed at the site of dissection.  In the absence of a mismatch, I do not think  intervening on the dissection is really going to help her symptoms but still carry a risk of hemorrhage.  I had extensive discussion with neuro IR regarding her case.  I spoke with Dr. Reagan Camera who also agrees with this assessment.  Dr. Reagan Camera also reviewed her imaging.  Our plan is to monitor her closely in the neuro ICU overnight with every hour neurochecks.  If she develops symptoms attributeable to the left ICA dissection, we will get stat imaging and consider intervention.  We also discussed that patient would benefit from receiving aspirinin a few hours, despite her having TNKase in the evening.  We felt that given the shorter half-life of TNKase, it is reasonable to give her an antiplatelet overnight to reduce the risk of platelet activation/aggregation at the site of the noted left ICA dissection.  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 - Antithrombotic: Will give her Aspirin 325mg  at 2200 tonight despite tnkase in the evening to reduce the risk of platelet activation/aggregation at the site of her L ICA dissection. - Patient will need to be on DAPT for 90 days. Will need follow up CT Angio head and neck in 2 months - DVT prophylaxis: SCDs. Pharmacologic prophylaxis if 24 h CTH does not demonstrate acute hemorrhage - Systolic Blood Pressure goal: < 180 mm Hg - Telemetry monitoring for arrhythmia: 72 hours - Swallow screen - ordered - PT/OT/SLP consults - STAT CTH, CTA Head and neck, CTP if she develops new symptoms or change in  NIHSS.   This patient is critically ill and at significant risk of neurological worsening, death and care requires constant monitoring of vital signs, hemodynamics,respiratory and cardiac monitoring, neurological assessment, discussion with family, other specialists and medical decision making of high complexity. I spent 105 minutes of neurocritical care time  in the care of  this patient. This was time spent independent of any time provided by nurse practitioner or PA.  Jestin Burbach [facility] 01/02/2024  9:05 PM  ______________________________________________________________________    Signed, Vlada Uriostegui, MD [facility]

## 2024-01-02 NOTE — Code Documentation (Addendum)
 Stroke Response Nurse Documentation Code Documentation  Patty [name] is a 48 y.o. female [facility] as code stroke activation. LKW approximately 1600, at home with her husband when she walked into the room sobbing appearing overwhelmed/upset and unable to speak. EMS was dispatched, en route noted to be hypertensive. Patient reports "whooshing" sounds in her head for several days.   Stroke team met at bridge, labs drawn, cleared for CT by EDP, taken to CT. NIH 0. CT obtained. No thrombectomy as exam not consistent with LVO. Patient remains in window for TNK--Care Plan: q27m NIH and VS until OOW (2030), MRI. Bedside handoff with ED RN Hannie.    Addendum: Positive MRI. Patient pulled from scanner as Neurologist actively looking at images: consent obtained, TNK given 1826. Patient taken for CTA/CTP. No IR at this time. Admit to ICU. Care Plan: NIH and VS q15 x2h, q30 x6h, then q1h.   Ronney Cola K  Rapid Response RN

## 2024-01-02 NOTE — ED Notes (Signed)
 Patient transported to MRI

## 2024-01-02 NOTE — ED Triage Notes (Addendum)
 Pt bib ems from home; pt's husband endorses sudden onset of difficulty speaking and dysphagia; LKW 1600 this afternoon; hx anxiety, ems endorses some improvement of symptoms en route; BP 160/100, HR 80, CBG 111

## 2024-01-02 NOTE — ED Provider Notes (Signed)
 Patty [name]  History  Chief Complaint:  No chief complaint on file.  The history is provided by the patient and the EMS personnel.     Patty [name] is a 48 y.o. female with a history of chronic back pain who presents to the emergency department for concern for stroke.  Per EMS they state that they were called out for difficulty speaking.  When they arrived patient was having difficulty articulating her words.  Husband at the scene stated that she came to him and signaling that she could not speak.  She was also sobbing and hysterical at this time.  EMS states that on scene they attempted oxygen and relaxing techniques however the persistence of symptoms concerned and therefore they transported the patient to the emergency department for stroke evaluation.  Husband who provides collateral history states that the patient came to him describing difficulty speaking.  He called EMS immediately.  She was standing when this was happening.  Past Medical History:  Diagnosis Date   Chronic back pain    officially undiagnosed    History reviewed. No pertinent surgical history.  History reviewed. No pertinent family history.  Social History   Tobacco Use   Smoking status: Never   Smokeless tobacco: Never  Substance Use Topics   Alcohol use: No   Drug use: No    Review of Systems  Review of Systems   Reviewed and documented in HPI if pertinent.   Physical Exam   Vitals:   01/02/24 2145 01/02/24 2200  BP: (!) 151/94 (!) 152/94  Pulse:  73  Resp:  11  Temp:    SpO2:  99%      Physical Exam Vitals and nursing [name] reviewed.  Constitutional:      General: She is in acute distress.     Appearance: She is well-developed.  HENT:     Head: Normocephalic and atraumatic.     Mouth/Throat:     Mouth: Mucous membranes are moist. No angioedema.     Pharynx: Oropharynx is clear. No pharyngeal swelling, posterior  oropharyngeal erythema or uvula swelling.  Eyes:     Conjunctiva/sclera: Conjunctivae normal.     Pupils: Pupils are equal, round, and reactive to light.  Cardiovascular:     Rate and Rhythm: Normal rate and regular rhythm.     Heart sounds: No murmur heard. Pulmonary:     Effort: Pulmonary effort is normal. No respiratory distress.  Abdominal:     Palpations: Abdomen is soft.     Tenderness: There is no abdominal tenderness.  Musculoskeletal:        General: No swelling.     Cervical back: Neck supple.  Skin:    General: Skin is warm and dry.     Capillary Refill: Capillary refill takes less than 2 seconds.  Neurological:     Mental Status: She is alert.     GCS: GCS eye subscore is 4. GCS verbal subscore is 5. GCS motor subscore is 6.     Comments: Difficult articulating words Correctly names objects however does take time to articulate speech Moves all 4 extremities spontaneously Does articulate that she feels like it is difficult to swallow  Psychiatric:        Mood and Affect: Mood normal.      Procedures   Procedures  ED Course - Medical Decision Making  Brief Overview Patty [name] is a 48 y.o. female who presents as  per above.  I have reviewed the nursing documentation for past medical history, family history, and social history and agree.  I have reviewed the patient's vital signs. There are no abnormalities.  Initial Differential Diagnoses: I am primarily concerned for CVA, electrolyte abnormalities, anxiety, panic attack.  Therapies: These medications and interventions were provided for the patient while in the ED.  Medications  sodium chloride flush (NS) 0.9 % injection 3 mL (3 mLs Intravenous Not Given 01/02/24 1749)  LORazepam (ATIVAN) injection 1 mg (1 mg Intravenous Given 01/02/24 1741)  clevidipine (CLEVIPREX) infusion 0.5 mg/mL (0 mg/hr Intravenous Hold 01/02/24 2020)   stroke: early stages of recovery book (has no administration in time range)   acetaminophen  (TYLENOL ) tablet 650 mg (650 mg Oral Given 01/02/24 2117)    Or  acetaminophen  (TYLENOL ) 160 MG/5ML solution 650 mg ( Per Tube See Alternative 01/02/24 2117)    Or  acetaminophen  (TYLENOL ) suppository 650 mg ( Rectal See Alternative 01/02/24 2117)  senna-docusate (Senokot-S) tablet 1 tablet (has no administration in time range)  pantoprazole (PROTONIX) injection 40 mg (40 mg Intravenous Given 01/02/24 2118)  Oral care mouth rinse (has no administration in time range)  Chlorhexidine Gluconate Cloth 2 % PADS 6 each (6 each Topical Given 01/02/24 2030)  aspirin tablet 325 mg (has no administration in time range)  sodium chloride (hypertonic) 3 % solution (has no administration in time range)  sodium chloride 23.4 % (4 mEq/mL) IV in VIAFLEX BAG (has no administration in time range)  lactated ringers bolus 1,000 mL (1,000 mLs Intravenous New Bag/Given 01/02/24 1749)  tenecteplase (TNKASE) injection for Stroke 17 mg (17 mg Intravenous Given 01/02/24 1826)  iohexol (OMNIPAQUE) 350 MG/ML injection 75 mL (75 mLs Intravenous Contrast Given 01/02/24 1844)  iohexol (OMNIPAQUE) 350 MG/ML injection 75 mL (75 mLs Intravenous Contrast Given 01/02/24 1845)    Testing Results: On my interpretation labs are significant for : Cr 0.88  On my interpretation imaging is significant for: CTA with dissection to ICA MRI concerning for infarct   See the EMR for full details regarding lab and imaging results.   Medical Decision Making Patty [name] is a 48 y.o. female with no pertinent PMHX  who presents w/ diffculty speaking and globus sensation, concerning for a stroke.   The patient was quickly assessed by myself and the attending. They were protecting/maintaining their own airway.The stroke team was emergently consulted. A through neurological exam was performed, and pertinent findings listed above. Labs performed and resulted above. Pertinent lab findings include largely reassuring.   Initially  after head CT Noncon it was thought that this possibly could do to anxiety/panic.  However given her persistent speech difficulty neurology did perform MRI, CTA.  This is concerning for focal dissection as well as infarct noted left frontal lobe.  After extensive discussion with neurology and the patient and with family at bedside they have elected to pursue TNK.  Please see neurology [name] for further details.  Patient will be admitted to the neuro service.  No further emergent interventions.  Problems Addressed: Acute ischemic stroke [facility]): acute illness or injury that poses a threat to life or bodily functions Difficulty with speech: acute illness or injury that poses a threat to life or bodily functions  Amount and/or Complexity of Data Reviewed Labs: ordered. Radiology: ordered.  Risk Decision regarding hospitalization.     ### All radiography studies, electrocardiograms, and laboratory data were personally reviewed by me and incorporated into my medical decision making. Impression  1. Acute ischemic stroke [facility])      [name]: Dragon medical dictation software was used in the creation of this [name].     Arminda Landmark, MD 01/02/24 1610    Jerilynn Montenegro, MD 01/03/24 1039

## 2024-01-03 ENCOUNTER — Inpatient Hospital Stay (HOSPITAL_COMMUNITY)

## 2024-01-03 DIAGNOSIS — N923 Ovulation bleeding: Secondary | ICD-10-CM

## 2024-01-03 DIAGNOSIS — I6389 Other cerebral infarction: Secondary | ICD-10-CM

## 2024-01-03 DIAGNOSIS — E785 Hyperlipidemia, unspecified: Secondary | ICD-10-CM

## 2024-01-03 DIAGNOSIS — I7771 Dissection of carotid artery: Secondary | ICD-10-CM | POA: Diagnosis not present

## 2024-01-03 DIAGNOSIS — I10 Essential (primary) hypertension: Secondary | ICD-10-CM | POA: Diagnosis not present

## 2024-01-03 LAB — ECHOCARDIOGRAM COMPLETE
AR max vel: 2.17 cm2
AV Area VTI: 2.48 cm2
AV Area mean vel: 2.07 cm2
AV Mean grad: 3 mmHg
AV Peak grad: 5.3 mmHg
Ao pk vel: 1.15 m/s
Area-P 1/2: 5.16 cm2
Calc EF: 54 %
MV VTI: 3.27 cm2
S' Lateral: 2.4 cm
Single Plane A2C EF: 56.7 %
Single Plane A4C EF: 52.5 %
Weight: 2222.24 [oz_av]

## 2024-01-03 LAB — LIPID PANEL
Cholesterol: 168 mg/dL (ref 0–200)
HDL: 64 mg/dL (ref >40)
LDL Cholesterol: 96 mg/dL (ref 0–99)
Total CHOL/HDL Ratio: 2.6 ratio
Triglycerides: 39 mg/dL (ref <150)
VLDL: 8 mg/dL (ref 0–40)

## 2024-01-03 LAB — RAPID URINE DRUG SCREEN, HOSP PERFORMED
Amphetamines: NOT DETECTED
Barbiturates: NOT DETECTED
Benzodiazepines: NOT DETECTED
Cocaine: NOT DETECTED
Opiates: NOT DETECTED
Tetrahydrocannabinol: NOT DETECTED

## 2024-01-03 LAB — HIV ANTIBODY (ROUTINE TESTING W REFLEX): HIV Screen 4th Generation wRfx: NONREACTIVE

## 2024-01-03 LAB — HEMOGLOBIN A1C
Hgb A1c MFr Bld: 4.9 % (ref 4.8–5.6)
Mean Plasma Glucose: 94 mg/dL

## 2024-01-03 MED ORDER — ASPIRIN 81 MG PO TBEC
81.0000 mg | DELAYED_RELEASE_TABLET | Freq: Every day | ORAL | Status: DC
  Administered 2024-01-03 – 2024-01-04 (×2): 81 mg via ORAL
  Filled 2024-01-03 (×2): qty 1

## 2024-01-03 MED ORDER — CLOPIDOGREL BISULFATE 75 MG PO TABS
75.0000 mg | ORAL_TABLET | Freq: Every day | ORAL | Status: DC
  Administered 2024-01-03 – 2024-01-04 (×2): 75 mg via ORAL
  Filled 2024-01-03 (×2): qty 1

## 2024-01-03 MED ORDER — BUTALBITAL-APAP-CAFFEINE 50-325-40 MG PO TABS
1.0000 | ORAL_TABLET | Freq: Three times a day (TID) | ORAL | Status: DC | PRN
  Administered 2024-01-03 – 2024-01-04 (×2): 1 via ORAL
  Filled 2024-01-03 (×2): qty 1

## 2024-01-03 MED ORDER — GABAPENTIN 300 MG PO CAPS
300.0000 mg | ORAL_CAPSULE | Freq: Every day | ORAL | Status: DC
  Administered 2024-01-03: 300 mg via ORAL
  Filled 2024-01-03: qty 1

## 2024-01-03 MED ORDER — ATORVASTATIN CALCIUM 40 MG PO TABS
40.0000 mg | ORAL_TABLET | Freq: Every day | ORAL | Status: DC
  Administered 2024-01-03 – 2024-01-04 (×2): 40 mg via ORAL
  Filled 2024-01-03 (×2): qty 1

## 2024-01-03 MED ORDER — VENLAFAXINE HCL ER 37.5 MG PO CP24
37.5000 mg | ORAL_CAPSULE | Freq: Every day | ORAL | Status: DC
  Administered 2024-01-03 – 2024-01-04 (×2): 37.5 mg via ORAL
  Filled 2024-01-03 (×3): qty 1

## 2024-01-03 MED ORDER — PANTOPRAZOLE SODIUM 40 MG PO TBEC
40.0000 mg | DELAYED_RELEASE_TABLET | Freq: Every day | ORAL | Status: DC
  Administered 2024-01-03: 40 mg via ORAL
  Filled 2024-01-03: qty 1

## 2024-01-03 MED ORDER — LORATADINE 10 MG PO TABS
10.0000 mg | ORAL_TABLET | Freq: Every day | ORAL | Status: DC
  Administered 2024-01-04: 10 mg via ORAL
  Filled 2024-01-03: qty 1

## 2024-01-03 NOTE — Progress Notes (Signed)
  Echocardiogram 2D Echocardiogram has been performed.  Ziyon Cedotal L Deena Shaub RDCS 01/03/2024, 8:18 AM

## 2024-01-03 NOTE — Progress Notes (Signed)
 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 After review by Dr. Alvira Josephs, it is recommended that the patient continue antiplatelet therapy. If asymptomatic, consider follow up in 6-8 weeks with CTA head/neck. If patient becomes symptomatic, please contact Dr. Alvira Josephs for further evaluation.   Please contact IR with questions/concerns.

## 2024-01-03 NOTE — Progress Notes (Signed)
 PT Cancellation Note  Patient Details Name: Patty [name] MRN: [id number] DOB: Aug 21, 1976   Cancelled Treatment:    Reason Eval/Treat Not Completed: Active bedrest order  Verdia Glad, PT, DPT Acute Rehabilitation Services Office [phone]    Claria Crofts 01/03/2024, 7:49 AM

## 2024-01-03 NOTE — Evaluation (Signed)
 Physical Therapy Brief Evaluation and Discharge Note Patient Details Name: Patty [name] MRN: [id number] DOB: 1976/01/09 Today's Date: 01/03/2024   History of Present Illness  Pt is a 48 y.o. female presenting 5/11 with difficulty speaking. MRI brain demonstrated diffusion restriction in the left frontal operculum concerning for an acute stroke. S/p TNK. 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 PMH significant for chronic back pain and sacroiliac pain.  Clinical Impression  Patient evaluated by Physical Therapy with no further acute PT needs identified. Pt overall is mobilizing well; pt reports significant improvement in expressive speech. Ambulating 250 ft with no assistive device and negotiated a half flight of steps independently. Does demonstrate slowed gait speed for age, but able to vary gait speed when instructed. Scoring 28/30 on FGA, indicating she is not at high risk for falls. All education has been completed and the patient has no further questions. No follow-up Physical Therapy or equipment needs. PT is signing off. Thank you for this referral.      PT Assessment Patient does not need any further PT services  Assistance Needed at Discharge  None    Equipment Recommendations None recommended by PT  Recommendations for Other Services       Precautions/Restrictions Precautions Precautions: None Restrictions Weight Bearing Restrictions Per Provider Order: No        Mobility  Bed Mobility   Supine/Sidelying to sit: Independent Sit to supine/sidelying: Independent    Transfers Overall transfer level: Independent                      Ambulation/Gait Ambulation/Gait assistance: Modified independent (Device/Increase time) (slower speed) Gait Distance (Feet): 250 Feet Assistive  device: None Gait Pattern/deviations: WFL(Within Functional Limits) Gait Speed: Below normal General Gait Details: 1.79 ft/s  Home Activity Instructions    Stairs Stairs: Yes Stairs assistance: Modified independent (Device/Increase time) Stair Management: One rail Left Number of Stairs: 12 General stair comments: step over step  Modified Rankin (Stroke Patients Only) Modified Rankin (Stroke Patients Only) Pre-Morbid Rankin Score: No symptoms Modified Rankin: No significant disability      Balance Overall balance assessment: Mild deficits observed, not formally tested Sitting-balance support: Feet supported Sitting balance-Leahy Scale: Normal     Standing balance support: No upper extremity supported, During functional activity Standing balance-Leahy Scale: Good            Pertinent Vitals/Pain PT - Brief Vital Signs All Vital Signs Stable: Yes Pain Assessment Pain Assessment: No/denies pain     Home Living Family/patient expects to be discharged to:: Private residence Living Arrangements: Spouse/significant other;Children (daughters) Available Help at Discharge: Family Home Environment: Stairs to enter  Landscape architect of Steps: 4 Home Equipment: None        Prior Function Level of Independence: Independent Comments: Armed forces operational officer    UE/LE Assessment   UE ROM/Strength/Tone/Coordination: WFL    LE ROM/Strength/Tone/Coordination: [facility]      Communication   Communication Communication: No apparent difficulties     Cognition Overall Cognitive Status: Appears within functional limits for tasks assessed/performed       General Comments      Exercises     Assessment/Plan    PT Problem List         PT Visit Diagnosis Unsteadiness on feet (R26.81)    No Skilled PT All education  completed;Patient will have necessary level of assist by caregiver at discharge;Patient is modified independent with all activity/mobility   Co-evaluation                 AMPAC 6 Clicks Help needed turning from your back to your side while in a flat bed without using bedrails?: None Help needed moving from lying on your back to sitting on the side of a flat bed without using bedrails?: None Help needed moving to and from a bed to a chair (including a wheelchair)?: None Help needed standing up from a chair using your arms (e.g., wheelchair or bedside chair)?: None Help needed to walk in hospital room?: None Help needed climbing 3-5 steps with a railing? : None 6 Click Score: 24      End of Session   Activity Tolerance: Patient tolerated treatment well Patient left: in bed;with call bell/phone within reach;with family/visitor present Nurse Communication: Mobility status PT Visit Diagnosis: Unsteadiness on feet (R26.81)     Time: 1610-9604 PT Time Calculation (min) (ACUTE ONLY): 18 min  Charges:   PT Evaluation $PT Eval Low Complexity: 1 Low      Verdia Glad, PT, DPT Acute Rehabilitation Services Office [phone]   Claria Crofts  01/03/2024, 11:27 AM

## 2024-01-03 NOTE — Progress Notes (Signed)
 STROKE TEAM PROGRESS NOTE   SUBJECTIVE (INTERVAL HISTORY) Her husband is at the bedside.  Overall her condition is completely resolved.  She still has some lethargy but neuro intact.  Denies any head trauma or injury, however she did have chiropractor with neck maneuver several weeks ago, also increased physical exercises at same time.    OBJECTIVE Temp:  [98.4 F (36.9 C)-98.9 F (37.2 C)] 98.6 F (37 C) (05/12 0800) Pulse Rate:  [56-102] 64 (05/12 0900) Cardiac Rhythm: Normal sinus rhythm (05/12 0400) Resp:  [11-24] 20 (05/12 0900) BP: (115-175)/(71-108) 138/83 (05/12 0900) SpO2:  [96 %-100 %] 98 % (05/12 0900) Weight:  [63 kg-67 kg] 63 kg (05/11 1939)  Recent Labs  Lab 01/02/24 1708  GLUCAP 111*   Recent Labs  Lab 01/02/24 1710 01/02/24 1712  NA 140 140  K 3.7 3.7  CL 107 108  CO2 21*  --   GLUCOSE 110* 107*  BUN 8 11  CREATININE 0.88 0.90  CALCIUM 9.6  --    Recent Labs  Lab 01/02/24 1710  AST 21  ALT 12  ALKPHOS 35*  BILITOT 0.9  PROT 7.2  ALBUMIN 4.0   Recent Labs  Lab 01/02/24 1710 01/02/24 1712  WBC 5.9  --   NEUTROABS 3.3  --   HGB 14.2 14.3  HCT 43.5 42.0  MCV 86.3  --   PLT 303  --    No results for input(s): "CKTOTAL", "CKMB", "CKMBINDEX", "TROPONINI" in the last 168 hours. Recent Labs    01/02/24 1710  LABPROT 12.6  INR 0.9   No results for input(s): "COLORURINE", "LABSPEC", "PHURINE", "GLUCOSEU", "HGBUR", "BILIRUBINUR", "KETONESUR", "PROTEINUR", "UROBILINOGEN", "NITRITE", "LEUKOCYTESUR" in the last 72 hours.  Invalid input(s): "APPERANCEUR"     Component Value Date/Time   CHOL 168 01/03/2024 0601   TRIG 39 01/03/2024 0601   HDL 64 01/03/2024 0601   CHOLHDL 2.6 01/03/2024 0601   VLDL 8 01/03/2024 0601   LDLCALC 96 01/03/2024 0601   No results found for: "HGBA1C"    Component Value Date/Time   LABOPIA NONE DETECTED 01/02/2024 2353   COCAINSCRNUR NONE DETECTED 01/02/2024 2353   LABBENZ NONE DETECTED 01/02/2024 2353    AMPHETMU NONE DETECTED 01/02/2024 2353   THCU NONE DETECTED 01/02/2024 2353   LABBARB NONE DETECTED 01/02/2024 2353    Recent Labs  Lab 01/02/24 1710  ETH <15    I have personally reviewed the radiological images below and agree with the radiology interpretations.  CT HEAD POST STROKE FOLLOWUP/TIMED/STAT READ Result Date: 01/02/2024 CLINICAL DATA:  Follow-up examination for stroke. EXAM: CT HEAD WITHOUT CONTRAST TECHNIQUE: Contiguous axial images were obtained from the base of the skull through the vertex without intravenous contrast. RADIATION DOSE REDUCTION: This exam was performed according to the departmental dose-optimization program which includes automated exposure control, adjustment of the mA and/or kV according to patient size and/or use of iterative reconstruction technique. COMPARISON:  Prior CT from earlier the same day. FINDINGS: Brain: Cerebral volume within normal limits. Previously identified small left MCA distribution cortical infarct not well visualized by CT. No other acute large vessel territory infarct. No acute intracranial hemorrhage. No mass lesion or midline shift. No hydrocephalus or extra-axial fluid collection. Vascular: No abnormal hyperdense vessel. Skull: Scalp soft tissues within normal limits.  Calvarium intact. Sinuses/Orbits: Globes orbital soft tissues within normal limits. Paranasal sinuses remain clear. No mastoid effusion. Other: None. IMPRESSION: 1. Previously identified small left frontal cortical infarct not well visualized by CT. 2. No other new  acute intracranial abnormality. No intracranial hemorrhage. Electronically Signed   By: Virgia Griffins M.D.   On: 01/02/2024 22:01   MR BRAIN WO CONTRAST Result Date: 01/02/2024 CLINICAL DATA:  Neuro deficit, concern for stroke, acute onset aphasia. EXAM: MRI HEAD WITHOUT CONTRAST TECHNIQUE: Multiplanar, multiecho pulse sequences of the brain and surrounding structures were obtained without intravenous  contrast. COMPARISON:  Same day CT head and CTA head and neck. FINDINGS: Brain: Focal restricted diffusion involving the cortex of the left frontal operculum within the left inferior frontal gyrus compatible with acute infarct. No evidence of intracranial hemorrhage. White matter is unremarkable. No edema or midline shift. Posterior fossa is unremarkable. Normal appearance of midline structures. The basilar cisterns are patent. No extra-axial fluid collections. Ventricles: Normal size and configuration of the ventricles. Vascular: Abnormal appearance of the left ICA flow void corresponding to findings of dissection on same day CTA. Skull and upper cervical spine: No focal abnormality. Sinuses/Orbits: Orbits are symmetric. Paranasal sinuses are clear. Other: Mastoid air cells are clear. 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 Abnormal left ICA flow void compatible with findings of dissection on CTA. Electronically Signed   By: Denny Flack M.D.   On: 01/02/2024 19:14   CT ANGIO HEAD NECK W WO CM (CODE STROKE) Result Date: 01/02/2024 CLINICAL DATA:  Neuro deficit, concern for stroke, sudden onset aphasia. EXAM: CT ANGIOGRAPHY HEAD AND NECK CT PERFUSION BRAIN TECHNIQUE: Multidetector CT imaging of the head and neck was performed using the standard protocol during bolus administration of intravenous contrast. Multiplanar CT image reconstructions and MIPs were obtained to evaluate the vascular anatomy. Carotid stenosis measurements (when applicable) are obtained utilizing NASCET criteria, using the distal internal carotid diameter as the denominator. Multiphase CT imaging of the brain was performed following IV bolus contrast injection. Subsequent parametric perfusion maps were calculated using RAPID software. RADIATION DOSE REDUCTION: This exam was performed according to the departmental dose-optimization program which includes automated exposure control, adjustment of the  mA and/or kV according to patient size and/or use of iterative reconstruction technique. CONTRAST:  75mL OMNIPAQUE IOHEXOL 350 MG/ML SOLN COMPARISON:  Same-day head CT. FINDINGS: CTA NECK FINDINGS Aortic arch: Standard configuration of the aortic arch. Imaged portion shows no evidence of aneurysm or dissection. No significant stenosis of the major arch vessel origins. Pulmonary arteries: As permitted by contrast timing, there are no filling defects in the visualized pulmonary arteries. Subclavian arteries: The subclavian arteries are patent bilaterally. Right carotid system: No evidence of dissection, stenosis (50% or greater), or occlusion. Left carotid system: The common carotid artery is patent to the ICA bifurcation. Proximal cervical ICA is relatively normal in caliber with abrupt caliber change of the vessel at approximately the level of C2. There is severe narrowing and significant irregularity of the mid and distal cervical ICA. Possible 1-2 mm short segment occlusion of the distal cervical ICA with reconstitution of the vessel. Vertebral arteries: Codominant. No evidence of dissection, stenosis (50% or greater), or occlusion. Skeleton: No acute or aggressive finding. Disc space narrowing noted at C5-6. Other neck: The visualized airway is patent. No cervical lymphadenopathy. Upper chest: Visualized lung apices are clear. Review of the MIP images confirms the above findings CTA HEAD FINDINGS ANTERIOR CIRCULATION: There is severe narrowing and possible occlusion at the posterior vertical portion of the left petrous ICA. There is reconstitution of the horizontal left petrous ICA which appears diminished in caliber and irregular with diminished intraluminal contrast. There is additional diminished intraluminal  contrast within the cavernous and supraclinoid ICAs without significant irregularity of the vessel. The right ICA is patent from the skull base to the ICA terminus without significant atherosclerosis or  stenosis. MCAs: The middle cerebral arteries are patent bilaterally. ACAs: The anterior cerebral arteries are patent bilaterally. POSTERIOR CIRCULATION: No significant stenosis, proximal occlusion, aneurysm, or vascular malformation. PCAs: The posterior cerebral arteries are patent bilaterally. Pcomm: Not well visualized. SCAs: The superior cerebellar arteries are patent bilaterally. Basilar artery: Patent AICAs: Not well visualized. PICAs: Patent Vertebral arteries: Patent bilaterally. Tortuosity of the left V4 segment. Venous sinuses: As permitted by contrast timing, patent. Anatomic variants: None Review of the MIP images confirms the above findings CT Brain Perfusion Findings: ASPECTS: 10 CBF (<30%) Volume: 0mL Perfusion (Tmax>6.0s) volume: 0mL Mismatch Volume: 0mL Infarction Location:None identified IMPRESSION: Irregularity and severe narrowing of the mid and distal left cervical ICA concerning for dissection. There is a short approximately 1-2 mm segment of occlusion of the distal left cervical ICA with reconstitution proximal to the skull base. Additional significant irregularity and likely focal occlusion at the posterior vertical portion of the left petrous ICA. Reconstitution of the horizontal left petrous ICA which is diminutive in caliber and irregularity concerning for intracranial propagation of dissection. Diminished intraluminal contrast within the left cavernous and left supraclinoid ICA without significant irregularity. Arterial vasculature in the head and neck is otherwise patent. No core infarct identified on CT perfusion. These results were called by telephone at the time of interpretation on 01/02/2024 at 6:40 pm to provider [facility] , who verbally acknowledged these results. Electronically Signed   By: Denny Flack M.D.   On: 01/02/2024 19:03   CT CEREBRAL PERFUSION W CONTRAST Result Date: 01/02/2024 CLINICAL DATA:  Neuro deficit, concern for stroke, sudden onset aphasia. EXAM: CT  ANGIOGRAPHY HEAD AND NECK CT PERFUSION BRAIN TECHNIQUE: Multidetector CT imaging of the head and neck was performed using the standard protocol during bolus administration of intravenous contrast. Multiplanar CT image reconstructions and MIPs were obtained to evaluate the vascular anatomy. Carotid stenosis measurements (when applicable) are obtained utilizing NASCET criteria, using the distal internal carotid diameter as the denominator. Multiphase CT imaging of the brain was performed following IV bolus contrast injection. Subsequent parametric perfusion maps were calculated using RAPID software. RADIATION DOSE REDUCTION: This exam was performed according to the departmental dose-optimization program which includes automated exposure control, adjustment of the mA and/or kV according to patient size and/or use of iterative reconstruction technique. CONTRAST:  75mL OMNIPAQUE IOHEXOL 350 MG/ML SOLN COMPARISON:  Same-day head CT. FINDINGS: CTA NECK FINDINGS Aortic arch: Standard configuration of the aortic arch. Imaged portion shows no evidence of aneurysm or dissection. No significant stenosis of the major arch vessel origins. Pulmonary arteries: As permitted by contrast timing, there are no filling defects in the visualized pulmonary arteries. Subclavian arteries: The subclavian arteries are patent bilaterally. Right carotid system: No evidence of dissection, stenosis (50% or greater), or occlusion. Left carotid system: The common carotid artery is patent to the ICA bifurcation. Proximal cervical ICA is relatively normal in caliber with abrupt caliber change of the vessel at approximately the level of C2. There is severe narrowing and significant irregularity of the mid and distal cervical ICA. Possible 1-2 mm short segment occlusion of the distal cervical ICA with reconstitution of the vessel. Vertebral arteries: Codominant. No evidence of dissection, stenosis (50% or greater), or occlusion. Skeleton: No acute or  aggressive finding. Disc space narrowing noted at C5-6. Other neck: The visualized  airway is patent. No cervical lymphadenopathy. Upper chest: Visualized lung apices are clear. Review of the MIP images confirms the above findings CTA HEAD FINDINGS ANTERIOR CIRCULATION: There is severe narrowing and possible occlusion at the posterior vertical portion of the left petrous ICA. There is reconstitution of the horizontal left petrous ICA which appears diminished in caliber and irregular with diminished intraluminal contrast. There is additional diminished intraluminal contrast within the cavernous and supraclinoid ICAs without significant irregularity of the vessel. The right ICA is patent from the skull base to the ICA terminus without significant atherosclerosis or stenosis. MCAs: The middle cerebral arteries are patent bilaterally. ACAs: The anterior cerebral arteries are patent bilaterally. POSTERIOR CIRCULATION: No significant stenosis, proximal occlusion, aneurysm, or vascular malformation. PCAs: The posterior cerebral arteries are patent bilaterally. Pcomm: Not well visualized. SCAs: The superior cerebellar arteries are patent bilaterally. Basilar artery: Patent AICAs: Not well visualized. PICAs: Patent Vertebral arteries: Patent bilaterally. Tortuosity of the left V4 segment. Venous sinuses: As permitted by contrast timing, patent. Anatomic variants: None Review of the MIP images confirms the above findings CT Brain Perfusion Findings: ASPECTS: 10 CBF (<30%) Volume: 0mL Perfusion (Tmax>6.0s) volume: 0mL Mismatch Volume: 0mL Infarction Location:None identified IMPRESSION: Irregularity and severe narrowing of the mid and distal left cervical ICA concerning for dissection. There is a short approximately 1-2 mm segment of occlusion of the distal left cervical ICA with reconstitution proximal to the skull base. Additional significant irregularity and likely focal occlusion at the posterior vertical portion of the left  petrous ICA. Reconstitution of the horizontal left petrous ICA which is diminutive in caliber and irregularity concerning for intracranial propagation of dissection. Diminished intraluminal contrast within the left cavernous and left supraclinoid ICA without significant irregularity. Arterial vasculature in the head and neck is otherwise patent. No core infarct identified on CT perfusion. These results were called by telephone at the time of interpretation on 01/02/2024 at 6:40 pm to provider [facility] , who verbally acknowledged these results. Electronically Signed   By: Denny Flack M.D.   On: 01/02/2024 19:03   CT HEAD CODE STROKE WO CONTRAST Result Date: 01/02/2024 CLINICAL DATA:  Code stroke.  Neuro deficit, concern for stroke. EXAM: CT HEAD WITHOUT CONTRAST TECHNIQUE: Contiguous axial images were obtained from the base of the skull through the vertex without intravenous contrast. RADIATION DOSE REDUCTION: This exam was performed according to the departmental dose-optimization program which includes automated exposure control, adjustment of the mA and/or kV according to patient size and/or use of iterative reconstruction technique. COMPARISON:  None Available. FINDINGS: Brain: No acute intracranial hemorrhage. No CT evidence of acute infarct. No edema, mass effect, or midline shift. The basilar cisterns are patent. Ventricles: The ventricles are normal. Vascular: No hyperdense vessel or unexpected calcification. Skull: No acute or aggressive finding. Orbits: Orbits are symmetric. Sinuses: The visualized paranasal sinuses are clear. Other: Mastoid air cells are clear. ASPECTS [facility] Stroke Program Early CT Score) - Ganglionic level infarction (caudate, lentiform nuclei, internal capsule, insula, M1-M3 cortex): 7 - Supraganglionic infarction (M4-M6 cortex): 3 Total score (0-10 with 10 being normal): 10 IMPRESSION: 1. No CT evidence of acute intracranial abnormality. 2. ASPECTS is 10 These results  were communicated to Dr. Murvin Arthurs at 5:26 pm on 01/02/2024 by text page via the [facility] messaging system. Electronically Signed   By: Denny Flack M.D.   On: 01/02/2024 17:26     PHYSICAL EXAM  Temp:  [98.4 F (36.9 C)-98.9 F (37.2 C)] 98.6 F (37 C) (05/12 0800)  Pulse Rate:  [56-102] 64 (05/12 0900) Resp:  [11-24] 20 (05/12 0900) BP: (115-175)/(71-108) 138/83 (05/12 0900) SpO2:  [96 %-100 %] 98 % (05/12 0900) Weight:  [16 kg-67 kg] 63 kg (05/11 1939)  General - Well nourished, well developed, in no apparent distress but mildly lethargic.  Ophthalmologic - fundi not visualized due to noncooperation.  Cardiovascular - Regular rhythm and rate.  Mental Status -  Level of arousal and orientation to time, place, and person were intact. Language including expression, naming, repetition, comprehension was assessed and found intact. Slowing talking  Fund of Knowledge was assessed and was intact.  Cranial Nerves II - XII - II - Visual field intact OU. III, IV, VI - Extraocular movements intact. V - Facial sensation intact bilaterally. VII - Facial movement intact bilaterally. VIII - Hearing & vestibular intact bilaterally. X - Palate elevates symmetrically. XI - Chin turning & shoulder shrug intact bilaterally. XII - Tongue protrusion intact.  Motor Strength - The patient's strength was normal in all extremities and pronator drift was absent.  Bulk was normal and fasciculations were absent.   Motor Tone - Muscle tone was assessed at the neck and appendages and was normal.  Reflexes - The patient's reflexes were symmetrical in all extremities and she had no pathological reflexes.  Sensory - Light touch, temperature/pinprick were assessed and were symmetrical.    Coordination - The patient had normal movements in the hands and feet with no ataxia or dysmetria.  Tremor was absent.  Gait and Station - deferred.   ASSESSMENT/PLAN Ms. OKLA ALBOR is a 48 y.o. female with  history of lower back pain and neck pain admitted for aphasia. TNK given.  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 Patient counseled to be compliant with her antithrombotic medications Ongoing aggressive stroke risk factor management Therapy recommendations: Outpatient speech Disposition: Pending  ICA dissection CT head and neck showed irregularity and severe narrowing of the mid and distal left cervical ICA concerning for dissection. There is a short approximately 1-2 mm segment of occlusion of the distal left cervical ICA with reconstitution proximal to the skull base. Additional significant irregularity and likely focal occlusion at the posterior vertical portion of the left petrous ICA. Reconstitution of the horizontal left petrous ICA which is diminutive in caliber and irregularity concerning for intracranial propagation of dissection. Diminished intraluminal contrast within the left cavernous and left supraclinoid ICA without significant irregularity. Could be related to patient neck maneuver with chiropractor and recent increased physical exercises Repeat CTA head and neck in 2 - 3 months On DAPT Recommend gentle neck movement and avoid abrupt neck movement/extension in the future  Hypertension BP high on presentation Stable now Long term BP goal normotensive  Hyperlipidemia Home meds: None LDL 96, goal < 70 Now on Lipitor 40 Continue statin at discharge  Intermenstrual bleeding Follow-up with [facility] OB[facility] On progesterone and estrogen Recommend to follow-up with OB/GYN regarding progesterone only  birth control pills if needed  Other Stroke Risk Factors   Other Active Problems Lower back pain and neck pain  Hospital day # 1  This  patient is critically ill due to stroke status post TNK and ICA dissection and at significant risk of neurological worsening, death form bleeding from TNK, recurrent stroke, ICA occlusion. This patient's care requires constant monitoring of vital signs, hemodynamics, respiratory and cardiac monitoring, review of multiple databases, neurological assessment, discussion with family, other specialists and medical decision making of high complexity. I spent 45 minutes of neurocritical care time in the care of this patient. I had long discussion with patient and husband at bedside, updated pt current condition, treatment plan and potential prognosis, and answered all the questions.  They expressed understanding and appreciation.     Consuelo Denmark, MD PhD Stroke Neurology 01/03/2024 11:11 AM    To contact Stroke Continuity provider, please refer to [email or web address]. After hours, contact General Neurology

## 2024-01-03 NOTE — Progress Notes (Signed)
 OT Cancellation Note  Patient Details Name: Patty [name] MRN: [id number] DOB: 10-Mar-1976   Cancelled Treatment:    Reason Eval/Treat Not Completed: OT screened, no needs identified, will sign off. Per pt, pt independent in ADL and SLP provided cognitive evaluation with all areas [facility] except language/verbal output. OT to screen with no needs identified. Please re-consult if change in status.   Karilyn Ouch, OTR/L [facility] Acute Rehabilitation Office: [phone]   Patty [name] 01/03/2024, 12:30 PM

## 2024-01-03 NOTE — Evaluation (Signed)
 Speech Language Pathology Evaluation Patient Details Name: Patty [name] MRN: [id number] DOB: July 19, 1976 Today's Date: 01/03/2024 Time: 4098-1191 SLP Time Calculation (min) (ACUTE ONLY): 29 min  Problem List:  Patient Active Problem List   Diagnosis Date Noted   Acute ischemic left MCA stroke (HCC) 01/02/2024   SI (sacroiliac) joint dysfunction 09/02/2021   Somatic dysfunction of spine, sacral 09/02/2021   Past Medical History:  Past Medical History:  Diagnosis Date   Chronic back pain    officially undiagnosed   Past Surgical History: History reviewed. No pertinent surgical history. HPI:  Pt is a 48 y.o. female presenting 5/11 with difficulty speaking. MRI brain demonstrated diffusion restriction in the left frontal operculum concerning for an acute stroke. S/p TNK. 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 PMH significant for chronic back pain and sacroiliac pain.   Assessment / Plan / Recommendation Clinical Impression  Cognitive-linguistic evaluation complete. All areas assessed are [facility] except for verbal output which is characterized by mild hesistation in initiation of verbal output, mildly decreased fluidity of speech, and reported phonemic substitutions suggestive of a motor speech deficit, possible aphemia as noted by MD and based on location of insult to brain. Cannot r/o asphasia however given reports of difficulty initiatlly and reports of patient knowing what she wants to say but with difficulty formulating words to come out of mouth. Additionally, SLP observed frequent swallowing with what appears to be difficult initiation. Patient reports globus that is occurring only when she attempts to speak. Again, this could be a motor planning issue or related to anxiety over hesistations in speech abilities.  Patient frequently tearful during evaluation and seemingly not wanting to elaborate on some of her concerns or specifics regarding how she is feeling. Teenage daughters present and question if this is impacting above. Education complete and SLP will continue to f/u briefly to ensure that appropriate therapy is set up for after discharge.    SLP Assessment  SLP Recommendation/Assessment: Patient needs continued Speech Lanaguage Pathology Services SLP Visit Diagnosis: Dysarthria and anarthria (R47.1)    Recommendations for follow up therapy are one component of a multi-disciplinary discharge planning process, led by the attending physician.  Recommendations may be updated based on patient status, additional functional criteria and insurance authorization.    Follow Up Recommendations  Outpatient SLP    Assistance Recommended at Discharge  Intermittent Supervision/Assistance  Functional Status Assessment Patient has had a recent decline in their functional status and demonstrates the ability to make significant improvements in function in a reasonable and predictable amount of time.  Frequency and Duration min 1 x/week  1 week      SLP Evaluation Cognition  Overall Cognitive Status: Within Functional Limits for tasks assessed       Comprehension  Auditory Comprehension Overall Auditory Comprehension: Appears within functional limits for tasks assessed Visual Recognition/Discrimination Discrimination: Within Function Limits Reading Comprehension Reading Status: Within funtional limits    Expression Expression Primary Mode of Expression: Verbal Verbal Expression Overall Verbal Expression: Impaired Initiation: Impaired Automatic Speech: Name (subtle) Level of Generative/Spontaneous Verbalization: Phrase Repetition: No impairment Naming: No impairment Pragmatics:  (tearful) Written Expression Dominant Hand: Right Written Expression: Within Functional Limits   Oral / Motor  Oral  Motor/Sensory Function Overall Oral Motor/Sensory Function: Within functional limits Motor Speech Overall Motor Speech: Impaired Respiration: Within functional limits  Phonation: Low vocal intensity (? baseline) Resonance: Within functional limits Articulation:  (see impression statement) Intelligibility: Intelligible Motor Planning: Impaired Level of Impairment: Word Motor Speech Errors: Aware           Patty Fife MA, CCC-SLP  Patty [name] 01/03/2024, 12:07 PM

## 2024-01-04 ENCOUNTER — Other Ambulatory Visit (HOSPITAL_COMMUNITY): Payer: Self-pay

## 2024-01-04 DIAGNOSIS — I63512 Cerebral infarction due to unspecified occlusion or stenosis of left middle cerebral artery: Secondary | ICD-10-CM | POA: Diagnosis not present

## 2024-01-04 DIAGNOSIS — I7771 Dissection of carotid artery: Secondary | ICD-10-CM | POA: Diagnosis not present

## 2024-01-04 MED ORDER — ASPIRIN 81 MG PO TBEC
81.0000 mg | DELAYED_RELEASE_TABLET | Freq: Every day | ORAL | 12 refills | Status: AC
  Filled 2024-01-04: qty 30, 30d supply, fill #0

## 2024-01-04 MED ORDER — CLOPIDOGREL BISULFATE 75 MG PO TABS
75.0000 mg | ORAL_TABLET | Freq: Every day | ORAL | 2 refills | Status: AC
  Filled 2024-01-04: qty 30, 30d supply, fill #0

## 2024-01-04 MED ORDER — ATORVASTATIN CALCIUM 40 MG PO TABS
40.0000 mg | ORAL_TABLET | Freq: Every day | ORAL | 2 refills | Status: AC
  Filled 2024-01-04: qty 30, 30d supply, fill #0

## 2024-01-04 MED ORDER — BUTALBITAL-APAP-CAFFEINE 50-325-40 MG PO TABS
1.0000 | ORAL_TABLET | Freq: Three times a day (TID) | ORAL | 0 refills | Status: DC | PRN
  Filled 2024-01-04: qty 14, 5d supply, fill #0

## 2024-01-04 NOTE — Plan of Care (Signed)

## 2024-01-04 NOTE — Discharge Instructions (Signed)
 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 You recovered well and is being discharged. You will need to take aspirin and plavix for 3 months and then aspirin alone. You will also need to take the cholesterol medication lipitor for at least 3 months and follow up with your primary care doctor and we want your bad cholesterol at least less than 100. You will also need to talk to your OBGYN doctor regarding the birth control pills. If not able to stop, consider progesterone only medication. Be careful with your neck movement and avoid any head or neck trauma and abrupt neck movement. You will follow up with neurology stroke clinic in 4 weeks, clinic will call you for appointment. If you do not receive a call, call the office number on the discharge instruction. If you experience stroke like symptoms again, please call 911 for assistance.

## 2024-01-04 NOTE — TOC Transition Note (Signed)
 Transition of Care [facility]) - Discharge Note   Patient Details  Name: Patty [name] MRN: [id number] Date of Birth: 11/03/1975  Transition of Care [facility] CM/SW Contact:  Jonathan Neighbor, RN Phone Number: 01/04/2024, 10:45 AM   Clinical Narrative:     Pt is discharging home with outpatient therapy through [facility]. Information on the AVS. Pt will call to schedule the first appointment.  Pt is from home with her spouse. They both work but spouse is taking some time off after d/c.  Spouse can provide needed transportation.  Pt manages her own medications without any issues.   Final next level of care: OP Rehab Barriers to Discharge: No Barriers Identified   Patient Goals and CMS Choice     Choice offered to / list presented to : Patient      Discharge Placement                       Discharge Plan and Services Additional resources added to the After Visit Summary for                                       Social Drivers of Health (SDOH) Interventions SDOH Screenings   Food Insecurity: No Food Insecurity (01/03/2024)  Housing: Low Risk  (01/03/2024)  Transportation Needs: No Transportation Needs (01/03/2024)  Utilities: Not At Risk (01/03/2024)  Tobacco Use: Low Risk  (01/02/2024)     Readmission Risk Interventions     No data to display

## 2024-01-04 NOTE — Discharge Summary (Signed)
 Stroke Discharge Summary  Patient ID: Patty [name]   MRN: [id number]      DOB: 28-Aug-1975  Date of Admission: 01/02/2024 Date of Discharge: 01/04/2024  Attending Physician:  Stroke, Md, MD, Stroke MD Consultant(s):    None  Patient's PCP:  Scifres, Dorothy, PA-C (Inactive)  DISCHARGE DIAGNOSIS:   Principal Problem:   Acute ischemic left MCA stroke (HCC)   Left carotid dissection  Secondary diagnosis HTN HLD OCP use  Allergies as of 01/04/2024   No Known Allergies      Medication List     STOP taking these medications    ibuprofen 200 MG tablet Commonly known as: ADVIL   Sronyx 0.1-20 MG-MCG tablet Generic drug: levonorgestrel-ethinyl estradiol       TAKE these medications    aspirin EC 81 MG tablet Take 1 tablet (81 mg total) by mouth daily. Swallow whole. Start taking on: Jan 05, 2024   atorvastatin 40 MG tablet Commonly known as: LIPITOR Take 1 tablet (40 mg total) by mouth daily. Start taking on: Jan 05, 2024   butalbital-acetaminophen -caffeine 50-325-40 MG tablet Commonly known as: FIORICET Take 1 tablet by mouth every 8 (eight) hours as needed for headache.   cetirizine 10 MG tablet Commonly known as: ZYRTEC Take 10 mg by mouth at bedtime.   clopidogrel 75 MG tablet Commonly known as: PLAVIX Take 1 tablet (75 mg total) by mouth daily. Start taking on: Jan 05, 2024   diazepam  5 MG tablet Commonly known as: VALIUM  Take 1 tablet (5 mg total) by mouth 2 (two) times daily. What changed:  when to take this reasons to take this   fluticasone 50 MCG/ACT nasal spray Commonly known as: FLONASE Place 1 spray into both nostrils daily.   gabapentin  300 MG capsule Commonly known as: NEURONTIN  Take 1 capsule (300 mg total) by mouth at bedtime as needed. What changed: when to take this   UNKNOWN TO PATIENT Apply 1 Application topically daily as needed (Cold Sore). Expired RX topical.   venlafaxine  XR 37.5 MG 24 hr capsule Commonly known  as: EFFEXOR -XR TAKE 1 CAPSULE BY MOUTH DAILY WITH BREAKFAST.        LABORATORY STUDIES CBC    Component Value Date/Time   WBC 5.9 01/02/2024 1710   RBC 5.04 01/02/2024 1710   HGB 14.3 01/02/2024 1712   HCT 42.0 01/02/2024 1712   PLT 303 01/02/2024 1710   MCV 86.3 01/02/2024 1710   MCH 28.2 01/02/2024 1710   MCHC 32.6 01/02/2024 1710   RDW 13.3 01/02/2024 1710   LYMPHSABS 1.8 01/02/2024 1710   MONOABS 0.6 01/02/2024 1710   EOSABS 0.1 01/02/2024 1710   BASOSABS 0.1 01/02/2024 1710   CMP    Component Value Date/Time   NA 140 01/02/2024 1712   K 3.7 01/02/2024 1712   CL 108 01/02/2024 1712   CO2 21 (L) 01/02/2024 1710   GLUCOSE 107 (H) 01/02/2024 1712   BUN 11 01/02/2024 1712   CREATININE 0.90 01/02/2024 1712   CALCIUM 9.6 01/02/2024 1710   PROT 7.2 01/02/2024 1710   ALBUMIN 4.0 01/02/2024 1710   AST 21 01/02/2024 1710   ALT 12 01/02/2024 1710   ALKPHOS 35 (L) 01/02/2024 1710   BILITOT 0.9 01/02/2024 1710   GFRNONAA >60 01/02/2024 1710   GFRAA >60 02/11/2015 1529   COAGS Lab Results  Component Value Date   INR 0.9 01/02/2024   Lipid Panel    Component Value Date/Time   CHOL 168  01/03/2024 0601   TRIG 39 01/03/2024 0601   HDL 64 01/03/2024 0601   CHOLHDL 2.6 01/03/2024 0601   VLDL 8 01/03/2024 0601   LDLCALC 96 01/03/2024 0601   HgbA1C  Lab Results  Component Value Date   HGBA1C 4.9 01/03/2024   Urinalysis    Component Value Date/Time   COLORURINE YELLOW 02/11/2015 1540   APPEARANCEUR CLEAR 02/11/2015 1540   LABSPEC 1.012 02/11/2015 1540   PHURINE 6.0 02/11/2015 1540   GLUCOSEU NEGATIVE 02/11/2015 1540   HGBUR NEGATIVE 02/11/2015 1540   BILIRUBINUR NEGATIVE 02/11/2015 1540   KETONESUR 15 (A) 02/11/2015 1540   PROTEINUR NEGATIVE 02/11/2015 1540   UROBILINOGEN 0.2 02/11/2015 1540   NITRITE NEGATIVE 02/11/2015 1540   LEUKOCYTESUR NEGATIVE 02/11/2015 1540   Urine Drug Screen     Component Value Date/Time   LABOPIA NONE DETECTED 01/02/2024  2353   COCAINSCRNUR NONE DETECTED 01/02/2024 2353   LABBENZ NONE DETECTED 01/02/2024 2353   AMPHETMU NONE DETECTED 01/02/2024 2353   THCU NONE DETECTED 01/02/2024 2353   LABBARB NONE DETECTED 01/02/2024 2353    Alcohol Level    Component Value Date/Time   ETH <15 01/02/2024 1710     SIGNIFICANT DIAGNOSTIC STUDIES MR BRAIN WO CONTRAST Result Date: 01/04/2024 CLINICAL DATA:  Stroke follow-up EXAM: MRI HEAD WITHOUT CONTRAST TECHNIQUE: Multiplanar, multiecho pulse sequences of the brain and surrounding structures were obtained without intravenous contrast. COMPARISON:  01/02/2024 brain MRI FINDINGS: Brain: There is a new, small focus of abnormal diffusion restriction within the left frontal lobe, slightly superior to the previously demonstrated location within the left frontal operculum. No acute or chronic hemorrhage. Normal white matter signal, parenchymal volume and CSF spaces. The midline structures are normal. Vascular: Abnormal left ICA flow void, unchanged. Unchanged area of hyperintense T1-weighted signal within the visualized part of the cervical left ICA. Skull and upper cervical spine: Normal calvarium and skull base. Visualized upper cervical spine and soft tissues are normal. Sinuses/Orbits:No paranasal sinus fluid levels or advanced mucosal thickening. No mastoid or middle ear effusion. Normal orbits. IMPRESSION: 1. New, small focus of acute ischemia within the left frontal lobe, slightly superior to the previously demonstrated location within the left frontal operculum. No hemorrhage or mass effect. 2. Abnormal left ICA flow void, unchanged. This remains concerning for dissection Electronically Signed   By: Juanetta Nordmann M.D.   On: 01/04/2024 00:00   ECHOCARDIOGRAM COMPLETE Result Date: 01/03/2024    ECHOCARDIOGRAM REPORT   Patient Name:   Patty [name] Date of Exam: 01/03/2024 Medical Rec #:  [id number]         Height:       62.0 in Accession #:    [id number]        Weight:        138.9 lb Date of Birth:  [birth year]         BSA:          1.637 m Patient Age:    48 years          BP:           133/82 mmHg Patient Gender: F                 HR:           66 bpm. Exam Location:  Inpatient Procedure: 2D Echo, Cardiac Doppler and Color Doppler (Both Spectral and Color            Flow Doppler were utilized during procedure). Indications:  Stroke  History:        Patient has no prior history of Echocardiogram examinations.  Sonographer:    Juanita Shaw Referring Phys: [id number] [facility] KHALIQDINA IMPRESSIONS  1. Left ventricular ejection fraction, by estimation, is 50 to 55%. Left ventricular ejection fraction by 2D MOD biplane is 54.0 %. The left ventricle has low normal function. The left ventricle has no regional wall motion abnormalities. Left ventricular diastolic parameters were normal.  2. Right ventricular systolic function is normal. The right ventricular size is normal. Tricuspid regurgitation signal is inadequate for assessing PA pressure.  3. The mitral valve is normal in structure. No evidence of mitral valve regurgitation. No evidence of mitral stenosis.  4. The aortic valve is tricuspid. There is mild calcification of the aortic valve. Aortic valve regurgitation is not visualized. No aortic stenosis is present.  5. The inferior vena cava is normal in size with greater than 50% respiratory variability, suggesting right atrial pressure of 3 mmHg. FINDINGS  Left Ventricle: Left ventricular ejection fraction, by estimation, is 50 to 55%. Left ventricular ejection fraction by 2D MOD biplane is 54.0 %. The left ventricle has low normal function. The left ventricle has no regional wall motion abnormalities. The left ventricular internal cavity size was normal in size. There is no left ventricular hypertrophy. Left ventricular diastolic parameters were normal. Right Ventricle: The right ventricular size is normal. No increase in right ventricular wall thickness. Right ventricular systolic  function is normal. Tricuspid regurgitation signal is inadequate for assessing PA pressure. Left Atrium: Left atrial size was normal in size. Right Atrium: Right atrial size was normal in size. Pericardium: There is no evidence of pericardial effusion. Mitral Valve: The mitral valve is normal in structure. No evidence of mitral valve regurgitation. No evidence of mitral valve stenosis. MV peak gradient, 1.3 mmHg. The mean mitral valve gradient is 1.0 mmHg. Tricuspid Valve: The tricuspid valve is normal in structure. Tricuspid valve regurgitation is not demonstrated. Aortic Valve: The aortic valve is tricuspid. There is mild calcification of the aortic valve. Aortic valve regurgitation is not visualized. No aortic stenosis is present. Aortic valve mean gradient measures 3.0 mmHg. Aortic valve peak gradient measures 5.3 mmHg. Aortic valve area, by VTI measures 2.48 cm. Pulmonic Valve: The pulmonic valve was normal in structure. Pulmonic valve regurgitation is not visualized. Aorta: The aortic root is normal in size and structure. Venous: The inferior vena cava is normal in size with greater than 50% respiratory variability, suggesting right atrial pressure of 3 mmHg. IAS/Shunts: No atrial level shunt detected by color flow Doppler.  LEFT VENTRICLE PLAX 2D                        Biplane EF (MOD) LVIDd:         3.70 cm         LV Biplane EF:   Left LVIDs:         2.40 cm                          ventricular LV PW:         0.70 cm                          ejection LV IVS:        0.80 cm  fraction by LVOT diam:     1.80 cm                          2D MOD LV SV:         56                               biplane is LV SV Index:   34                               54.0 %. LVOT Area:     2.54 cm                                Diastology                                LV e' medial:    10.90 cm/s LV Volumes (MOD)               LV E/e' medial:  6.2 LV vol d, MOD    108.0 ml      LV e' lateral:   9.57 cm/s  A2C:                           LV E/e' lateral: 7.0 LV vol d, MOD    104.0 ml A4C: LV vol s, MOD    46.8 ml A2C: LV vol s, MOD    49.4 ml A4C: LV SV MOD A2C:   61.2 ml LV SV MOD A4C:   104.0 ml LV SV MOD BP:    57.1 ml RIGHT VENTRICLE             IVC RV Basal diam:  3.20 cm     IVC diam: 1.00 cm RV Mid diam:    2.70 cm RV S prime:     10.40 cm/s TAPSE (M-mode): 2.3 cm LEFT ATRIUM           Index        RIGHT ATRIUM           Index LA diam:      2.00 cm 1.22 cm/m   RA Area:     14.60 cm LA Vol (A2C): 20.7 ml 12.64 ml/m  RA Volume:   35.10 ml  21.44 ml/m LA Vol (A4C): 26.8 ml 16.37 ml/m  AORTIC VALVE                    PULMONIC VALVE AV Area (Vmax):    2.17 cm     PV Vmax:       0.64 m/s AV Area (Vmean):   2.07 cm     PV Peak grad:  1.6 mmHg AV Area (VTI):     2.48 cm AV Vmax:           115.00 cm/s AV Vmean:          75.400 cm/s AV VTI:            0.225 m AV Peak Grad:      5.3 mmHg AV Mean Grad:      3.0 mmHg LVOT Vmax:         98.20  cm/s LVOT Vmean:        61.300 cm/s LVOT VTI:          0.219 m LVOT/AV VTI ratio: 0.97  AORTA Ao Root diam: 3.10 cm Ao Asc diam:  2.80 cm MITRAL VALVE MV Area (PHT): 5.16 cm    SHUNTS MV Area VTI:   3.27 cm    Systemic VTI:  0.22 m MV Peak grad:  1.3 mmHg    Systemic Diam: 1.80 cm MV Mean grad:  1.0 mmHg MV Vmax:       0.58 m/s MV Vmean:      35.2 cm/s MV Decel Time: 147 msec MV E velocity: 67.10 cm/s MV A velocity: 51.90 cm/s MV E/A ratio:  1.29 Dalton McleanMD Electronically signed by Archer Bear Signature Date/Time: 01/03/2024/1:43:11 PM    Final    CT HEAD POST STROKE FOLLOWUP/TIMED/STAT READ Result Date: 01/02/2024 CLINICAL DATA:  Follow-up examination for stroke. EXAM: CT HEAD WITHOUT CONTRAST TECHNIQUE: Contiguous axial images were obtained from the base of the skull through the vertex without intravenous contrast. RADIATION DOSE REDUCTION: This exam was performed according to the departmental dose-optimization program which includes automated exposure control,  adjustment of the mA and/or kV according to patient size and/or use of iterative reconstruction technique. COMPARISON:  Prior CT from earlier the same day. FINDINGS: Brain: Cerebral volume within normal limits. Previously identified small left MCA distribution cortical infarct not well visualized by CT. No other acute large vessel territory infarct. No acute intracranial hemorrhage. No mass lesion or midline shift. No hydrocephalus or extra-axial fluid collection. Vascular: No abnormal hyperdense vessel. Skull: Scalp soft tissues within normal limits.  Calvarium intact. Sinuses/Orbits: Globes orbital soft tissues within normal limits. Paranasal sinuses remain clear. No mastoid effusion. Other: None. IMPRESSION: 1. Previously identified small left frontal cortical infarct not well visualized by CT. 2. No other new acute intracranial abnormality. No intracranial hemorrhage. Electronically Signed   By: Virgia Griffins M.D.   On: 01/02/2024 22:01   MR BRAIN WO CONTRAST Result Date: 01/02/2024 CLINICAL DATA:  Neuro deficit, concern for stroke, acute onset aphasia. EXAM: MRI HEAD WITHOUT CONTRAST TECHNIQUE: Multiplanar, multiecho pulse sequences of the brain and surrounding structures were obtained without intravenous contrast. COMPARISON:  Same day CT head and CTA head and neck. FINDINGS: Brain: Focal restricted diffusion involving the cortex of the left frontal operculum within the left inferior frontal gyrus compatible with acute infarct. No evidence of intracranial hemorrhage. White matter is unremarkable. No edema or midline shift. Posterior fossa is unremarkable. Normal appearance of midline structures. The basilar cisterns are patent. No extra-axial fluid collections. Ventricles: Normal size and configuration of the ventricles. Vascular: Abnormal appearance of the left ICA flow void corresponding to findings of dissection on same day CTA. Skull and upper cervical spine: No focal abnormality. Sinuses/Orbits:  Orbits are symmetric. Paranasal sinuses are clear. Other: Mastoid air cells are clear. 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 Abnormal left ICA flow void compatible with findings of dissection on CTA. Electronically Signed   By: Denny Flack M.D.   On: 01/02/2024 19:14   CT ANGIO HEAD NECK W WO CM (CODE STROKE) Result Date: 01/02/2024 CLINICAL DATA:  Neuro deficit, concern for stroke, sudden onset aphasia. EXAM: CT ANGIOGRAPHY HEAD AND NECK CT PERFUSION BRAIN TECHNIQUE: Multidetector CT imaging of the head and neck was performed using the standard protocol during bolus administration of intravenous contrast. Multiplanar CT image reconstructions and MIPs were obtained to evaluate the vascular  anatomy. Carotid stenosis measurements (when applicable) are obtained utilizing NASCET criteria, using the distal internal carotid diameter as the denominator. Multiphase CT imaging of the brain was performed following IV bolus contrast injection. Subsequent parametric perfusion maps were calculated using RAPID software. RADIATION DOSE REDUCTION: This exam was performed according to the departmental dose-optimization program which includes automated exposure control, adjustment of the mA and/or kV according to patient size and/or use of iterative reconstruction technique. CONTRAST:  75mL OMNIPAQUE IOHEXOL 350 MG/ML SOLN COMPARISON:  Same-day head CT. FINDINGS: CTA NECK FINDINGS Aortic arch: Standard configuration of the aortic arch. Imaged portion shows no evidence of aneurysm or dissection. No significant stenosis of the major arch vessel origins. Pulmonary arteries: As permitted by contrast timing, there are no filling defects in the visualized pulmonary arteries. Subclavian arteries: The subclavian arteries are patent bilaterally. Right carotid system: No evidence of dissection, stenosis (50% or greater), or occlusion. Left carotid system: The common carotid artery is patent to  the ICA bifurcation. Proximal cervical ICA is relatively normal in caliber with abrupt caliber change of the vessel at approximately the level of C2. There is severe narrowing and significant irregularity of the mid and distal cervical ICA. Possible 1-2 mm short segment occlusion of the distal cervical ICA with reconstitution of the vessel. Vertebral arteries: Codominant. No evidence of dissection, stenosis (50% or greater), or occlusion. Skeleton: No acute or aggressive finding. Disc space narrowing noted at C5-6. Other neck: The visualized airway is patent. No cervical lymphadenopathy. Upper chest: Visualized lung apices are clear. Review of the MIP images confirms the above findings CTA HEAD FINDINGS ANTERIOR CIRCULATION: There is severe narrowing and possible occlusion at the posterior vertical portion of the left petrous ICA. There is reconstitution of the horizontal left petrous ICA which appears diminished in caliber and irregular with diminished intraluminal contrast. There is additional diminished intraluminal contrast within the cavernous and supraclinoid ICAs without significant irregularity of the vessel. The right ICA is patent from the skull base to the ICA terminus without significant atherosclerosis or stenosis. MCAs: The middle cerebral arteries are patent bilaterally. ACAs: The anterior cerebral arteries are patent bilaterally. POSTERIOR CIRCULATION: No significant stenosis, proximal occlusion, aneurysm, or vascular malformation. PCAs: The posterior cerebral arteries are patent bilaterally. Pcomm: Not well visualized. SCAs: The superior cerebellar arteries are patent bilaterally. Basilar artery: Patent AICAs: Not well visualized. PICAs: Patent Vertebral arteries: Patent bilaterally. Tortuosity of the left V4 segment. Venous sinuses: As permitted by contrast timing, patent. Anatomic variants: None Review of the MIP images confirms the above findings CT Brain Perfusion Findings: ASPECTS: 10 CBF  (<30%) Volume: 0mL Perfusion (Tmax>6.0s) volume: 0mL Mismatch Volume: 0mL Infarction Location:None identified IMPRESSION: Irregularity and severe narrowing of the mid and distal left cervical ICA concerning for dissection. There is a short approximately 1-2 mm segment of occlusion of the distal left cervical ICA with reconstitution proximal to the skull base. Additional significant irregularity and likely focal occlusion at the posterior vertical portion of the left petrous ICA. Reconstitution of the horizontal left petrous ICA which is diminutive in caliber and irregularity concerning for intracranial propagation of dissection. Diminished intraluminal contrast within the left cavernous and left supraclinoid ICA without significant irregularity. Arterial vasculature in the head and neck is otherwise patent. No core infarct identified on CT perfusion. These results were called by telephone at the time of interpretation on 01/02/2024 at 6:40 pm to provider [facility] , who verbally acknowledged these results. Electronically Signed   By: Aldine Humphreys.D.  On: 01/02/2024 19:03   CT CEREBRAL PERFUSION W CONTRAST Result Date: 01/02/2024 CLINICAL DATA:  Neuro deficit, concern for stroke, sudden onset aphasia. EXAM: CT ANGIOGRAPHY HEAD AND NECK CT PERFUSION BRAIN TECHNIQUE: Multidetector CT imaging of the head and neck was performed using the standard protocol during bolus administration of intravenous contrast. Multiplanar CT image reconstructions and MIPs were obtained to evaluate the vascular anatomy. Carotid stenosis measurements (when applicable) are obtained utilizing NASCET criteria, using the distal internal carotid diameter as the denominator. Multiphase CT imaging of the brain was performed following IV bolus contrast injection. Subsequent parametric perfusion maps were calculated using RAPID software. RADIATION DOSE REDUCTION: This exam was performed according to the departmental dose-optimization  program which includes automated exposure control, adjustment of the mA and/or kV according to patient size and/or use of iterative reconstruction technique. CONTRAST:  75mL OMNIPAQUE IOHEXOL 350 MG/ML SOLN COMPARISON:  Same-day head CT. FINDINGS: CTA NECK FINDINGS Aortic arch: Standard configuration of the aortic arch. Imaged portion shows no evidence of aneurysm or dissection. No significant stenosis of the major arch vessel origins. Pulmonary arteries: As permitted by contrast timing, there are no filling defects in the visualized pulmonary arteries. Subclavian arteries: The subclavian arteries are patent bilaterally. Right carotid system: No evidence of dissection, stenosis (50% or greater), or occlusion. Left carotid system: The common carotid artery is patent to the ICA bifurcation. Proximal cervical ICA is relatively normal in caliber with abrupt caliber change of the vessel at approximately the level of C2. There is severe narrowing and significant irregularity of the mid and distal cervical ICA. Possible 1-2 mm short segment occlusion of the distal cervical ICA with reconstitution of the vessel. Vertebral arteries: Codominant. No evidence of dissection, stenosis (50% or greater), or occlusion. Skeleton: No acute or aggressive finding. Disc space narrowing noted at C5-6. Other neck: The visualized airway is patent. No cervical lymphadenopathy. Upper chest: Visualized lung apices are clear. Review of the MIP images confirms the above findings CTA HEAD FINDINGS ANTERIOR CIRCULATION: There is severe narrowing and possible occlusion at the posterior vertical portion of the left petrous ICA. There is reconstitution of the horizontal left petrous ICA which appears diminished in caliber and irregular with diminished intraluminal contrast. There is additional diminished intraluminal contrast within the cavernous and supraclinoid ICAs without significant irregularity of the vessel. The right ICA is patent from the  skull base to the ICA terminus without significant atherosclerosis or stenosis. MCAs: The middle cerebral arteries are patent bilaterally. ACAs: The anterior cerebral arteries are patent bilaterally. POSTERIOR CIRCULATION: No significant stenosis, proximal occlusion, aneurysm, or vascular malformation. PCAs: The posterior cerebral arteries are patent bilaterally. Pcomm: Not well visualized. SCAs: The superior cerebellar arteries are patent bilaterally. Basilar artery: Patent AICAs: Not well visualized. PICAs: Patent Vertebral arteries: Patent bilaterally. Tortuosity of the left V4 segment. Venous sinuses: As permitted by contrast timing, patent. Anatomic variants: None Review of the MIP images confirms the above findings CT Brain Perfusion Findings: ASPECTS: 10 CBF (<30%) Volume: 0mL Perfusion (Tmax>6.0s) volume: 0mL Mismatch Volume: 0mL Infarction Location:None identified IMPRESSION: Irregularity and severe narrowing of the mid and distal left cervical ICA concerning for dissection. There is a short approximately 1-2 mm segment of occlusion of the distal left cervical ICA with reconstitution proximal to the skull base. Additional significant irregularity and likely focal occlusion at the posterior vertical portion of the left petrous ICA. Reconstitution of the horizontal left petrous ICA which is diminutive in caliber and irregularity concerning for intracranial propagation of dissection.  Diminished intraluminal contrast within the left cavernous and left supraclinoid ICA without significant irregularity. Arterial vasculature in the head and neck is otherwise patent. No core infarct identified on CT perfusion. These results were called by telephone at the time of interpretation on 01/02/2024 at 6:40 pm to provider [facility] , who verbally acknowledged these results. Electronically Signed   By: Denny Flack M.D.   On: 01/02/2024 19:03   CT HEAD CODE STROKE WO CONTRAST Result Date: 01/02/2024 CLINICAL  DATA:  Code stroke.  Neuro deficit, concern for stroke. EXAM: CT HEAD WITHOUT CONTRAST TECHNIQUE: Contiguous axial images were obtained from the base of the skull through the vertex without intravenous contrast. RADIATION DOSE REDUCTION: This exam was performed according to the departmental dose-optimization program which includes automated exposure control, adjustment of the mA and/or kV according to patient size and/or use of iterative reconstruction technique. COMPARISON:  None Available. FINDINGS: Brain: No acute intracranial hemorrhage. No CT evidence of acute infarct. No edema, mass effect, or midline shift. The basilar cisterns are patent. Ventricles: The ventricles are normal. Vascular: No hyperdense vessel or unexpected calcification. Skull: No acute or aggressive finding. Orbits: Orbits are symmetric. Sinuses: The visualized paranasal sinuses are clear. Other: Mastoid air cells are clear. ASPECTS [facility] Stroke Program Early CT Score) - Ganglionic level infarction (caudate, lentiform nuclei, internal capsule, insula, M1-M3 cortex): 7 - Supraganglionic infarction (M4-M6 cortex): 3 Total score (0-10 with 10 being normal): 10 IMPRESSION: 1. No CT evidence of acute intracranial abnormality. 2. ASPECTS is 10 These results were communicated to Dr. Murvin Arthurs at 5:26 pm on 01/02/2024 by text page via the [facility] messaging system. Electronically Signed   By: Denny Flack M.D.   On: 01/02/2024 17:26      HISTORY OF PRESENT ILLNESS Patty [name] is a 47 y.o. female with hx of chronic back pain, sacroiliac pain who presents with aphemia. LKW 1600. She was at home having a normal day. She walked into the room where her husband was and was tearful and sobbing. Repetitive swallowing. Husband attempted stroke scale and could not get her to speak. Called 911. EMS arrived. Patient had no other deficit. EMS were on the scene for 20 misn with no significant speech improvement. She was standing up, able to walk by  herself and had no other deficit.   She was activated and brought in as a code stroke. Some improvement enroute and able to speak in a short phrase or single word.   On my evaluation, slight hyperventilation. Appears tearful. She is able to mouth words initially, able to speak a few words.   She is able to name thumb, pinky finger, glove, identify colors, able to identify objects and aphasia pictures. She is oriented to her age and month and year. NIHSS is a 0 despite very limited speech output.   She was initially not offered tnkase as her symptoms were felt to be more concerning for globus sensation in the setting of a potential panic attack. However, husband mentioned that this is very unlike her and she has never had this in the past in the setting of a panic attack. Patient noted to be able to write without any difficulties. She was taken for a STAT MRI Brain then which demonstrated diffusion restriction in the left frontal operculum concerning for an acute stroke. Despite her NIHSS of 0, significantly limited speech output secondary to aphemia was felt to be disabling and she was offered tnkase. I had extensive discussion with patient and  her husband about tnkase including 30% chance of improvement and about 3-5% risk of ICH which can worsen her symptoms and even result in death. Patient and family consented to tnkase. Pharmacy mixed tnkase and it was given to patient at 1826.   Patient was taken for STAT CT Angio of the head and neck which was notable for irregularity and severe narrowing of the mid and distal left ICA concerning for a dissection with approximately 1 to 2 mm segment occlusion of the distal left cer focal ICA and then focal occlusion of left petrous ICA with reconstitution and good flow in the distal left cavernous and supraclinoid ICA.  Rest of the arterial vasculature including the left MCA branches are intact with no occlusion. CT perfusion with no mismatch.   Upon further  history from husband, patient had been reporting a whooshing sound in her left ear for the last 2 to 3 days.  Patient also reports a recent visit to hair salon with her head hyperextended onto the sink for an extended period of time.  Also reports a visit to chiropractor a couple weeks ago but did not have any chiropractic neck manipulation.   LKW: 1600 Modified rankin score: 0-Completely asymptomatic and back to baseline post- stroke IV Thrombolysis: initially not offered, as symptoms felt to be less concerning for stroke.   EVT: not offered, no mismatch on CT perfusion.   HOSPITAL COURSE Patty [name] is a 48 y.o. female with history of lower back pain and neck pain admitted for aphasia. TNK given.   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 Patient counseled to be compliant with her antithrombotic medications Ongoing aggressive stroke risk factor management Therapy recommendations: Outpatient speech Disposition: Pending   ICA dissection CT head and neck showed irregularity and severe narrowing of the mid and distal left cervical ICA concerning for dissection. There is a short approximately 1-2 mm segment of occlusion of the distal left cervical ICA with reconstitution proximal to the skull base. Additional significant irregularity and likely focal occlusion at the posterior vertical portion of the left petrous ICA. Reconstitution of the horizontal left petrous ICA which is diminutive in caliber and irregularity concerning for intracranial propagation of dissection. Diminished intraluminal contrast within the  left cavernous and left supraclinoid ICA without significant irregularity. Could be related to patient neck maneuver with chiropractor and recent increased physical exercises Repeat CTA head and neck in 2 - 3 months On DAPT for 3 months Recommend gentle neck movement and avoid abrupt neck movement/extension in the future   Hypertension BP high on presentation Stable now Long term BP goal normotensive   Hyperlipidemia Home meds: None LDL 96, goal < 70 Now on Lipitor 40 Continue statin at discharge   OCP use Follow-up with [facility] On progesterone and estrogen Recommend to follow-up with OB/GYN to see if pt can be off OCP, if not consider progesterone only OCP   Other Stroke Risk Factors     Other Active Problems Lower back pain and neck pain   DISCHARGE EXAM Blood pressure (!) 146/97, pulse 72, temperature 97.9 F (36.6  C), temperature source Oral, resp. rate 20, height 5\' 2"  (1.575 m), weight 63 kg, SpO2 94%.  General - Well nourished, well developed, in no apparent distress but mildly lethargic.   Ophthalmologic - fundi not visualized due to noncooperation.   Cardiovascular - Regular rhythm and rate.   Mental Status -  Level of arousal and orientation to time, place, and person were intact. Language including expression, naming, repetition, comprehension was assessed and found intact. Slowing talking  Fund of Knowledge was assessed and was intact.   Cranial Nerves II - XII - II - Visual field intact OU. III, IV, VI - Extraocular movements intact. V - Facial sensation intact bilaterally. VII - Facial movement intact bilaterally. VIII - Hearing & vestibular intact bilaterally. X - Palate elevates symmetrically. XI - Chin turning & shoulder shrug intact bilaterally. XII - Tongue protrusion intact.   Motor Strength - The patient's strength was normal in all extremities and pronator drift was absent.  Bulk was normal and fasciculations were absent.   Motor Tone  - Muscle tone was assessed at the neck and appendages and was normal.   Reflexes - The patient's reflexes were symmetrical in all extremities and she had no pathological reflexes.   Sensory - Light touch, temperature/pinprick were assessed and were symmetrical.     Coordination - The patient had normal movements in the hands and feet with no ataxia or dysmetria.  Tremor was absent.   Gait and Station - normal gait in room  Discharge Diet       Diet   Diet regular Room service appropriate? Yes; Fluid consistency: Thin   liquids  DISCHARGE PLAN Disposition:  home aspirin 81 mg daily and clopidogrel 75 mg daily for secondary stroke prevention for 3 weeks then ASA alone. Ongoing stroke risk factor control by Primary Care Physician at time of discharge Follow-up PCP Scifres, Perla Bradford, PA-C (Inactive) in 2 weeks. Follow-up in [facility] in 4-6 weeks, office to schedule an appointment.   Patient instruction: You are admitted for small stroke on the left brain, likely due to left carotid artery dissection. You recovered well and is being discharged. You will need to take aspirin and plavix for 3 months and then aspirin alone. You will also need to take the cholesterol medication lipitor for at least 3 months and follow up with your primary care doctor and we want your bad cholesterol at least less than 100. You will also need to talk to your OBGYN doctor regarding the birth control pills. If not able to stop, consider progesterone only medication. Be careful with your neck movement and avoid any head or neck trauma and abrupt neck movement. You will follow up with neurology stroke clinic in 4 weeks, clinic will call you for appointment. If you do not receive a call, call the office number on the discharge instruction. If you experience stroke like symptoms again, please call 911 for assistance.   40 minutes were spent preparing discharge.  Consuelo Denmark, MD  PhD Stroke Neurology 01/04/2024 2:40 PM

## 2024-01-06 NOTE — Progress Notes (Unsigned)
  [facility] [address] [place] [address] Phone: [phone] Subjective:   Patty [name], am serving as a scribe for Dr. Ronnell Coins.  I'm seeing this patient by the request  of:  Scifres, Dorothy, PA-C (Inactive)  CC: Back and neck pain with follow-up  BJY:[id number]  Patty [name] is a 48 y.o. female coming in with complaint of back and neck pain. OMT 11/12/2023. Stroke on 01/02/2024.  Patient was discharged on the 13th with aspirin and Plavix as well as statin.  Patient states that she has had some numbness in tailbone and lower back. Tightness in neck today. Pain on both sides of neck.   Medications patient has been prescribed: Gabapentin , Effexor   Taking:  Echocardiogram was normal.     Patient's initial MRI showed an abnormal left ICA below that could have been concerning for dissection.  Patient then did have a CT cerebral perfusion with contrast showing severe narrowing of the proximal cervical ICA with possible 1 to 2 mm short segment of occlusion with reconstitution of the vessel thereafter.  Reviewed prior external information including notes and imaging from previsou exam, outside providers and external EMR if available.   As well as notes that were available from care everywhere and other healthcare systems.  Past medical history, social, surgical and family history all reviewed in electronic medical record.  No pertanent information unless stated regarding to the chief complaint.   Past Medical History:  Diagnosis Date   Chronic back pain    officially undiagnosed    No Known Allergies   Review of Systems:  No  visual changes, nausea, vomiting, diarrhea, constipation, dizziness, abdominal pain, skin rash, fevers, chills, night sweats, weight loss, swollen lymph nodes, body aches, joint swelling, chest pain, shortness of breath, mood changes. POSITIVE muscle aches, headache  Objective  Blood pressure 120/84, height  5\' 2"  (1.575 m), weight 139 lb (63 kg).   General: No apparent distress alert and oriented x3 mood and affect normal, appears tired. HEENT: Pupils equal, extraocular movements intact no nystagmus noted. When listening to the carotids no bruits appreciated today. Patient is sitting comfortably, and some very mild word finding difficulties.  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  He did have aphasia but seems to be doing better at the moment.  Is having chronic headaches.  Discussed potentially increasing gabapentin .  Given manage so she can take 100 mg in the a.m. and p.m. and continue her 300 mg at night.  Patient needs to continue her aspirin and Plavix as well as the Lipitor.  Would like to refer patient more urgently to neurology to further discuss before she sees Dr. Janett Medin.  Patient was unavailable to be seen until August for that I think we can have her seen better so she can have more questions answered.  We discussed with her husband as well and I do think we answered most of the questions and encouraged them to ask any more if necessary.  Do feel genetic workup will be necessary with family history of her uncle dying of a brain aneurysm.                 Note: This dictation was prepared with Dragon dictation along with smaller phrase technology. Any transcriptional errors that result from this process are unintentional.

## 2024-01-06 NOTE — Therapy (Signed)
 OUTPATIENT SPEECH LANGUAGE PATHOLOGY APHASIA EVALUATION   Patient Name: Patty [name] MRN: [id number] DOB:[birth year], 48 y.o., female Today's Date: 01/08/2024  PCP: Joneen Nelson, PA-C REFERRING PROVIDER: Consuelo Denmark ,MD  END OF SESSION:  End of Session - 01/08/24 0602     Visit Number 1    Number of Visits 9    Date for SLP Re-Evaluation 03/18/24    Authorization Type [employer]    SLP Start Time 0758    SLP Stop Time  0846    SLP Time Calculation (min) 48 min    Activity Tolerance Patient tolerated treatment well             Past Medical History:  Diagnosis Date   Chronic back pain    officially undiagnosed   History reviewed. No pertinent surgical history. Patient Active Problem List   Diagnosis Date Noted   Acute ischemic left MCA stroke (HCC) 01/02/2024   SI (sacroiliac) joint dysfunction 09/02/2021   Somatic dysfunction of spine, sacral 09/02/2021    ONSET DATE: 01/02/2024   REFERRING DIAG: I63.9 (ICD-10-CM) - Acute ischemic stroke (HCC)   THERAPY DIAG:  Aphasia  Dysarthria and anarthria  Rationale for Evaluation and Treatment: Rehabilitation  SUBJECTIVE:   SUBJECTIVE STATEMENT: "It's frustrating" re: communication differences since stroke. Pt engaged and cooperative throughout today's evaluation.  Pt accompanied by: self and significant other  PERTINENT HISTORY: 48 y.o. female presenting 5/11 with difficulty speaking. MRI brain demonstrated diffusion restriction in the left frontal operculum concerning for an acute stroke. S/p TNK. 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 PMH significant for chronic back pain and sacroiliac pain.   PAIN:  Are you having pain? Yes: NPRS scale: 5/10 Pain location: head Pain description: headache Aggravating factors: n/a Relieving factors:  n/a  FALLS: Has patient fallen in last 6 months?  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  DIAGNOSTIC FINDINGS: 01/03/2024 acute SLP evaluation Clinical Impression   Cognitive-linguistic evaluation complete. All areas assessed are [facility] except for verbal output which is characterized by mild hesistation in initiation of verbal output, mildly decreased fluidity of speech, and reported phonemic substitutions suggestive of a motor speech deficit, possible aphemia as noted by MD and based on location of insult to brain. Cannot r/o asphasia however given reports of difficulty initiatlly and reports of patient knowing what she wants to say but with difficulty formulating words to come out of mouth. Additionally, SLP observed frequent swallowing with what appears to be difficult initiation. Patient reports globus that is occurring only when she attempts to speak. Again, this could be a motor planning issue or related to anxiety over hesistations in speech abilities. Patient frequently tearful during evaluation and seemingly not wanting to elaborate on some of her concerns or specifics regarding how she is feeling. Teenage daughters present and question if this is impacting above. Education complete and SLP will continue to f/u briefly to ensure that appropriate therapy is set up for after discharge.    COGNITION: Overall cognitive status: Within functional limits for tasks assessed 1 AUDITORY COMPREHENSION:  Overall auditory comprehension: Appears intact YES/NO questions: Appears intact Following directions: Appears intact Conversation: Moderately Complex Interfering  components: motor planning and processing speed Effective technique: extra processing time  READING COMPREHENSION: Intact  EXPRESSION: verbal  VERBAL EXPRESSION: Level of generative/spontaneous verbalization: conversation Automatic speech: name: intact, social response: intact, and counting: intact  Repetition: Appears intact Naming: Responsive: 100%, Confrontation: 100%, and Divergent: x17 animals, x8 letter "f" Pragmatics: Appears intact Comments: speech halting, hesitancy observed. No overt anomia evidenced but pt endorsing, describing as "I have the word but can't say it"  Interfering components: ? aphemia per chart review Effective technique: extended time Non-verbal means of communication: N/A  WRITTEN EXPRESSION: Dominant hand: right Written expression: Appears intact Comments: c/w speech, appears slowed and demonstrating hesitancy for highly salient and personally relevant stimuli. Pt reports not c/w baseline.   MOTOR SPEECH: assessed across variety of speech tasks: reading, word repetition, generative discourse sample Overall motor speech: impaired Level of impairment: Word Rate of Speech: Reduced Dysfluencies: none evidenced Phonation: normal Conversational loudness average: 69 dB Voice Quality: normal Respiration: thoracic breathing Word and Phrasal Stress: WFL Resonance: WFL Articulation: Impaired: mild, rare imprecision. Pt reports challenges with blends, does ok using slow speech Diadochokinetic Rate (DDK): slow rate and poorly sequenced alternating task Intelligibility: Intelligible Motor planning: Impaired: aware Interfering components: n/a Effective technique: slow rate and over articulate  ORAL MOTOR EXAMINATION: Overall status: WFL  STANDARDIZED ASSESSMENTS: [place] Standard Test 106/169 (at-risk zone, indicating potential impairment but not conclusive)   PATIENT REPORTED OUTCOME MEASURES (PROM): The Communicative Participation Item Bank         Does your condition interfere with... Pt Rating   ...talking with people you know 3   ...communicating when you need to say something quickly 2   ...talking with people you do not know 3   ...communicating when you are out in your community 3   ...asking questions in a conversation 2   ....communicating in a small group of people 3   ...having a long conversation 3   ...giving detailed infomrmation 2   ...getting your turn in a fast moving conversation 1   ...trying to persuade a friend or family member to see a different point of view 1   23  3= Not at all; 2=A little; 1=Quite a bit; 0=Very much                                                                                                                         TREATMENT DATE:  01/07/24: Education provided on evaluation results and SLP's recommendations. Pt verbalizes agreement with POC, all questions answered to satisfaction. Initiated training re: rehabilitative techniques for aphasia. Recommended asking for more time as needed, using intentional circumlocution as needed to express thoughts and ideas. Will plan to initiate HEP training first therapy session    PATIENT EDUCATION: Education details: POC goals Person educated: Patient and Spouse Education method: Explanation and Demonstration Education comprehension: verbalized understanding and returned demonstration  GOALS: Goals reviewed with patient? Yes  SHORT TERM GOALS: Target date: 02/05/2024  Pt will complete clinical swallow evaluation first session Baseline: Goal status: INITIAL  2.  Pt will demonstrate understanding of x3 HEP activities  Baseline:  Goal status: INITIAL  LONG TERM GOALS: Target date: 03/18/24  Pt will complete dysphagia exercises targeting motor planning with mod-I Baseline:  Goal status: INITIAL  2.  Pt will ID communication challenges in home and work environment and with A from SLP, generate solution  Baseline:  Goal status: INITIAL  3.   Pt will complete complex structured language tasks with 90% accuracy  Baseline:  Goal status: INITIAL  4.  Pt will be independent with HEP Baseline:  Goal status: INITIAL  ASSESSMENT:  CLINICAL IMPRESSION: Patient is a 48 y.o. F who was seen today for cognitive linguistic evaluation s/p CVA. Evaluation reveals mild expressive language impairment c/b challenges with word finding. Additional deficits noted with mild impairments in motor speech. Pt reports sense of challenges swallowing saliva. Suspect motor speech and swallow impairments 2/2 motor planning deficit (mild). Swallow to be formally assessed next session. Pt reports frustration with speech and language, strong desire to work towards return to baseline. Will plan to target both speech and language via high level language tasks to aid in return to baseline.   OBJECTIVE IMPAIRMENTS: include expressive language and dysarthria. These impairments are limiting patient from effectively communicating at home and in community. Factors affecting potential to achieve goals and functional outcome are none noted. Patient will benefit from skilled SLP services to address above impairments and improve overall function.  REHAB POTENTIAL: Excellent  PLAN:  SLP FREQUENCY: 1x/week  SLP DURATION: 10 weeks  PLANNED INTERVENTIONS: Language facilitation, Cueing hierachy, Internal/external aids, Oral motor exercises, Functional tasks, Multimodal communication approach, SLP instruction and feedback, Compensatory strategies, Patient/family education, [phone] Treatment of speech (30 or 45 min) , and [id number]- Speech [address], [place], Phon, Eval Compre, Express  Alston Jerry, CCC-SLP 01/08/2024, 6:04 AM

## 2024-01-07 ENCOUNTER — Ambulatory Visit: Attending: Neurology | Admitting: Speech Pathology

## 2024-01-07 ENCOUNTER — Encounter: Payer: Self-pay | Admitting: Speech Pathology

## 2024-01-07 ENCOUNTER — Ambulatory Visit: Admitting: Family Medicine

## 2024-01-07 ENCOUNTER — Encounter: Payer: Self-pay | Admitting: Family Medicine

## 2024-01-07 VITALS — BP 120/84 | Ht 62.0 in | Wt 139.0 lb

## 2024-01-07 DIAGNOSIS — R4701 Aphasia: Secondary | ICD-10-CM | POA: Diagnosis present

## 2024-01-07 DIAGNOSIS — I63512 Cerebral infarction due to unspecified occlusion or stenosis of left middle cerebral artery: Secondary | ICD-10-CM | POA: Diagnosis not present

## 2024-01-07 DIAGNOSIS — R471 Dysarthria and anarthria: Secondary | ICD-10-CM | POA: Insufficient documentation

## 2024-01-07 DIAGNOSIS — I639 Cerebral infarction, unspecified: Secondary | ICD-10-CM | POA: Insufficient documentation

## 2024-01-07 MED ORDER — GABAPENTIN 100 MG PO CAPS: 200.0000 mg | ORAL_CAPSULE | Freq: Two times a day (BID) | ORAL | 0 refills | Status: DC

## 2024-01-07 NOTE — Assessment & Plan Note (Addendum)
 Acute ischemic left-sided MCA stroke.  He did have aphasia but seems to be doing better at the moment.  Is having chronic headaches.  Discussed potentially increasing gabapentin .  Given manage so she can take 100 mg in the a.m. and p.m. and continue her 300 mg at night.  Patient needs to continue her aspirin and Plavix as well as the Lipitor.  Would like to refer patient more urgently to neurology to further discuss before she sees Dr. Janett Medin.  Patient was unavailable to be seen until August for that I think we can have her seen better so she can have more questions answered.  We discussed with her husband as well and I do think we answered most of the questions and encouraged them to ask any more if necessary.  Do feel genetic workup will be necessary with family history of her uncle dying of a brain aneurysm.  We did discuss with patient as well as husband about the osteopath manipulation being a potential source of this.  But the internal carotid and us  using muscle energy on the cervical spine makes it extremely highly, highly unlikely as well as with time interval between.   85% occur within 7 days of any trauma and well over 99% within the first 31 days.  More concerned that there is more of a genetic component.  Patient also brings on attention that she was doing significant flexing with some different Pilates along with lifting motions that could have been potentially contributing.  I did tell her still I do not think that is the main cause and likely still need further workup.  Did discuss with patient as well about my personal history status post stroke and I am highly optimistic that patient will do well long-term with this.

## 2024-01-07 NOTE — Patient Instructions (Addendum)
 Neurology referral Increase gabapentin  to 200mg  BID, stay with 300mg  at night Start gabapentin  100mg  2x a day Don't be afraid to contact us  earlier See you again in 6-8 weeks

## 2024-01-08 ENCOUNTER — Encounter: Payer: Self-pay | Admitting: Speech Pathology

## 2024-01-10 NOTE — Progress Notes (Signed)
 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  Last OMT cervical manipulation occurred far too long prior to onset of stroke and symptoms (headache/neck pain) started prior to visit at hair salon.  Her workout regimen does not involve any excessive weight or neck turning. Elevated blood pressure   As she is experiencing new right sided pulsatile tinnitus since last imaging, will check CTA head and neck now. Will also check autoimmune labs (ANA, ENA panel, ANCA) and genetic testing for vascular EDS Secondary stroke prevention: Continue ASA and Plavix  DAPT for 3 months, followed by ASA 81mg  daily monotherapy. Lipitor 40mg  daily (LDL goal less than 70) - repeat lipid panel in 3 months (about a week prior to follow up) Normotensive blood pressure - advised to recheck blood pressure at home or at a pharmacy and if still elevated, then to contact PCP's office Hgb A1c goal less than 70 Repeat CTA head and neck in 3 months, about a week prior to follow up. Gentle neck movements, no jarring neck movements, do not lift more than 15 lbs For headache/neck pain, continue gabapentin  100mg /100mg /200mg .  If no improvement in one week, contact me Follow up 3 months.  Total time spent in chart, reviewing imaging and face to face with patient:  66 minutes   Subjective:  Patty [name] is a 48 year old right-handed female with chronic back pain and sacroiliac pain but otherwise no significant past medical history who presents for stroke.  History supplemented by hospital records and referring  provider's note.  She is accompanied by her husband who also supplements history.  In late April, she began experiencing head pressure and neck tightness as well as whooshing sound in her left ear.  On 01/02/2024, the headache was worse, diffuse pressure, eyelids heavy.  She tried watering the plants but was spilling the water.  She had a snack but felt nauseous.  She then had trouble getting words out, effortful and stuttering speech.  There was questionable facial asymmetry when she smiled but no obvious facial droop.  .  No focal extremity weakness or numbness.  She was brought by EMS to [facility] where she was noted STAT MRI of brain revealed acute infarct in the left frontal operculum.  She received tnkase .  CTA head and neck revealed left ICA dissection.  2D echo showed EF 50-55% with no atrial level shunt.  LDL 96 and Hgb A1c 4.9. UDS negative.  She was discharged on ASA 81mg  and Plavix  75mg  daily for 3 months followed by ASA alone.  She was also discharged on Lipitor 40mg .  Prior to discharge, she started experiencing pulsatile tinnitus in her right ear.  Since discharge, she continues to have head pressure and neck pain with some sensitivity to light and sound.  Language is improved but sometimes may miss a word or stutter momentarily when speaking.  She was already taking gabapentin  200mg  at bedtime for her chronic back pain, which was increased to 100mg  in AM, 100mg  afternoon and 200mg  at bedtime last week.  She still notes pulsatile tinnitus in the right ear exacerbated when laying on her right  side.  Blood pressure at home reportedly 120s SBP.  Works as a Production designer, theatre/television/film at [employer].  On her feet often but no heavy lifting.  01/02/2024 CT HEAD WO:  1. No CT evidence of acute intracranial abnormality. 2. ASPECTS is 10 01/02/2024 MRI BRAIN WO:  Focal acute infarct involving cortex within the left frontal operculum.  Abnormal left ICA flow void compatible with findings of dissection on  CTA. 01/02/2024 CTA HEAD &  NECK:  Irregularity and severe narrowing of the mid and distal left cervical ICA concerning for dissection. There is a short approximately 1-2 mm segment of occlusion of the distal left cervical ICA with reconstitution proximal to the skull base.  Additional significant irregularity and likely focal occlusion at the posterior vertical portion of the left petrous ICA. Reconstitution of the horizontal left petrous ICA which is diminutive in caliber and irregularity concerning for intracranial propagation of dissection.  Diminished intraluminal contrast within the left cavernous and left supraclinoid ICA without significant irregularity.  Arterial vasculature in the head and neck is otherwise patent. 01/02/2024 CT PERFUSION:  No core infarct identified on CT perfusion.  01/02/2024 CT HEAD POST STROKE FOLLOW-UP:  1. Previously identified small left frontal cortical infarct not well visualized by CT. 2. No other new acute intracranial abnormality. No intracranial hemorrhage. 01/03/2024 MRI BRAIN POST STROKE FOLLOW-UP:  1. New, small focus of acute ischemia within the left frontal lobe, slightly superior to the previously demonstrated location within the left frontal operculum. No hemorrhage or mass effect. 2. Abnormal left ICA flow void, unchanged. This remains concerning for dissection  PAST MEDICAL HISTORY: Past Medical History:  Diagnosis Date   Chronic back pain    officially undiagnosed    PAST SURGICAL HISTORY: No past surgical history on file.  MEDICATIONS: Current Outpatient Medications on File Prior to Visit  Medication Sig Dispense Refill   aspirin  EC 81 MG tablet Take 1 tablet (81 mg total) by mouth daily. Swallow whole. 30 tablet 12   atorvastatin  (LIPITOR) 40 MG tablet Take 1 tablet (40 mg total) by mouth daily. 30 tablet 2   butalbital -acetaminophen -caffeine  (FIORICET ) 50-325-40 MG tablet Take 1 tablet by mouth every 8 (eight) hours as needed for headache. 14  tablet 0   cetirizine (ZYRTEC) 10 MG tablet Take 10 mg by mouth at bedtime.     clopidogrel  (PLAVIX ) 75 MG tablet Take 1 tablet (75 mg total) by mouth daily. 30 tablet 2   diazepam  (VALIUM ) 5 MG tablet Take 1 tablet (5 mg total) by mouth 2 (two) times daily. (Patient taking differently: Take 5 mg by mouth every 12 (twelve) hours as needed for muscle spasms.) 15 tablet 0   fluticasone (FLONASE) 50 MCG/ACT nasal spray Place 1 spray into both nostrils daily.     gabapentin  (NEURONTIN ) 100 MG capsule Take 2 capsules (200 mg total) by mouth 2 (two) times daily. 360 capsule 0   gabapentin  (NEURONTIN ) 300 MG capsule Take 1 capsule (300 mg total) by mouth at bedtime as needed. (Patient taking differently: Take 300 mg by mouth at bedtime.) 90 capsule 0   UNKNOWN TO PATIENT Apply 1 Application topically daily as needed (Cold Sore). Expired RX topical.     venlafaxine  XR (EFFEXOR -XR) 37.5 MG 24 hr capsule TAKE 1 CAPSULE BY MOUTH DAILY WITH BREAKFAST. 90 capsule 1   No current facility-administered medications on file prior to visit.    ALLERGIES: No Known Allergies  FAMILY HISTORY: No family history on file.  Objective:  Blood pressure [name])  158/92, pulse 61, height 5\' 6"  (1.676 m), weight 139 lb 6.4 oz (63.2 kg), SpO2 100%. General: No acute distress.  Patient appears well-groomed.   Head:  Normocephalic/atraumatic Eyes:  fundi examined but not visualized Neck: supple, no paraspinal tenderness, full range of motion Heart: regular rate and rhythm Vascular: No carotid bruits. Neurological Exam: Mental status: alert and oriented to person, place, and time, speech fluent and not dysarthric, language intact. Cranial nerves: CN I: not tested CN II: pupils equal, round and reactive to light, visual fields intact CN III, IV, VI:  full range of motion, no nystagmus, no ptosis CN V: facial sensation intact. CN VII: upper and lower face symmetric CN VIII: hearing intact CN IX, X: gag intact, uvula  midline CN XI: sternocleidomastoid and trapezius muscles intact CN XII: tongue midline Bulk & Tone: normal, no fasciculations. Motor:  muscle strength 5/5 throughout Sensation:  Pinprick and vibratory sensation intact. Deep Tendon Reflexes:  2+ throughout,  toes downgoing.   Finger to nose testing:  Without dysmetria.     Gait:  Normal station and stride.  Able to turn and walk in tandem.  Romberg negative.    Thank you for allowing me to take part in the care of this patient.  Janne Members, DO  CC: Ronnell Coins, DO

## 2024-01-11 ENCOUNTER — Other Ambulatory Visit

## 2024-01-11 ENCOUNTER — Encounter: Payer: Self-pay | Admitting: Neurology

## 2024-01-11 ENCOUNTER — Ambulatory Visit (INDEPENDENT_AMBULATORY_CARE_PROVIDER_SITE_OTHER): Admitting: Neurology

## 2024-01-11 VITALS — BP 158/92 | HR 61 | Ht 66.0 in | Wt 139.4 lb

## 2024-01-11 DIAGNOSIS — I63032 Cerebral infarction due to thrombosis of left carotid artery: Secondary | ICD-10-CM | POA: Diagnosis not present

## 2024-01-11 DIAGNOSIS — R030 Elevated blood-pressure reading, without diagnosis of hypertension: Secondary | ICD-10-CM | POA: Diagnosis not present

## 2024-01-11 DIAGNOSIS — I7771 Dissection of carotid artery: Secondary | ICD-10-CM

## 2024-01-11 NOTE — Patient Instructions (Addendum)
 Due to left sided whooshing, repeat CTA of head and neck now. Check labs:  ANA with ENA panel, ANCA, and genetic testing for vascular Ehler Danlos Continue aspirin  81mg  daily and Plavix  75mg  daily for now Continue atorvastatin  Continue gabapentin  100mg  in morning, 100mg  in afternoon and 200mg  at bedtime.  If no improvement in 1 week, contact me Gentle neck movements, no jarring neck movements, do not lift anything more than 15 lbs Repeat another CTA head and neck in 3 months Fasting lipid panel in 3 months (a week prior to follow up) Follow up in 3 months (about a week prior to follow up with me)

## 2024-01-12 ENCOUNTER — Other Ambulatory Visit: Payer: Self-pay | Admitting: Neurology

## 2024-01-13 ENCOUNTER — Ambulatory Visit
Admission: RE | Admit: 2024-01-13 | Discharge: 2024-01-13 | Disposition: A | Discharge status: Home / Self Care | Source: Ambulatory Visit | Attending: Neurology | Admitting: Neurology

## 2024-01-13 DIAGNOSIS — I63032 Cerebral infarction due to thrombosis of left carotid artery: Secondary | ICD-10-CM

## 2024-01-13 DIAGNOSIS — I7771 Dissection of carotid artery: Secondary | ICD-10-CM

## 2024-01-13 MED ORDER — IOPAMIDOL (ISOVUE-370) INJECTION 76%
75.0000 mL | Freq: Once | INTRAVENOUS | Status: AC | PRN
  Administered 2024-01-13: 75 mL via INTRAVENOUS

## 2024-01-14 ENCOUNTER — Telehealth: Payer: Self-pay | Admitting: Neurology

## 2024-01-14 ENCOUNTER — Other Ambulatory Visit: Payer: Self-pay | Admitting: Neurology

## 2024-01-14 ENCOUNTER — Ambulatory Visit: Admitting: Speech Pathology

## 2024-01-14 ENCOUNTER — Encounter: Payer: Self-pay | Admitting: Neurology

## 2024-01-14 DIAGNOSIS — R471 Dysarthria and anarthria: Secondary | ICD-10-CM

## 2024-01-14 DIAGNOSIS — R4701 Aphasia: Secondary | ICD-10-CM

## 2024-01-14 MED ORDER — GABAPENTIN 300 MG PO CAPS
300.0000 mg | ORAL_CAPSULE | Freq: Three times a day (TID) | ORAL | 5 refills | Status: AC
Start: 2024-01-14 — End: ?

## 2024-01-14 NOTE — Telephone Encounter (Signed)
 Pt called in and left a message. She is wanting her CT results

## 2024-01-14 NOTE — Therapy (Signed)
 OUTPATIENT SPEECH LANGUAGE PATHOLOGY APHASIA EVALUATION   Patient Name: Patty [name] MRN: [id number] DOB:1976/05/21, 48 y.o., female Today's Date: 01/14/2024  PCP: Joneen Nelson, PA-C REFERRING PROVIDER: Consuelo Denmark ,MD  END OF SESSION:  End of Session - 01/14/24 1151     Visit Number 2    Number of Visits 9    Date for SLP Re-Evaluation 03/18/24    Authorization Type [employer]    SLP Start Time 1151    SLP Stop Time  1230    SLP Time Calculation (min) 39 min    Activity Tolerance Patient tolerated treatment well              Past Medical History:  Diagnosis Date   Chronic back pain    officially undiagnosed   No past surgical history on file. Patient Active Problem List   Diagnosis Date Noted   Acute ischemic left MCA stroke (HCC) 01/02/2024   SI (sacroiliac) joint dysfunction 09/02/2021   Somatic dysfunction of spine, sacral 09/02/2021    ONSET DATE: 01/02/2024   REFERRING DIAG: I63.9 (ICD-10-CM) - Acute ischemic stroke (HCC)   THERAPY DIAG:  Aphasia  Dysarthria and anarthria  Rationale for Evaluation and Treatment: Rehabilitation  SUBJECTIVE:   SUBJECTIVE STATEMENT: "improving for sure"  Pt accompanied by: self and significant other  PERTINENT HISTORY: 48 y.o. female presenting 5/11 with difficulty speaking. MRI brain demonstrated diffusion restriction in the left frontal operculum concerning for an acute stroke. S/p TNK. 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 PMH significant for chronic back pain and sacroiliac pain.   PAIN:  Are you having pain? Yes: NPRS scale: 5/10 Pain location: head Pain description: headache Aggravating factors: n/a Relieving factors: n/a  FALLS: Has patient fallen in last 6 months?  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                                                                                                                        TREATMENT DATE:  01/14/24: Addressed motor planning via patient instruction for utilization of systematic practice for error'd productions. Advised pt to (1) slow down, repeat correctly x3, (2) increase rate, repeat correctly x3, (3) say correctly at normal speech rate x3. Discussed potential benefit from writing down challenging words for repetitive practice. Goal is to reinforce accurate motor planning for increased speech accuracy.   Addressed anomia via patient instruction for anomia strategies. Discussed benefit of increasing "inputs" to aid in word finding, ongoing communication efficacy, and strengthening of lexical connections. Led pt  through practice describing moderately complex lexicon with pt demonstrating success in 5/5 trials.   Pt reports only swallowing complaint is dry mouth, likely 2/2 medication changes. Provided resource for xerostomia management strategies.   01/07/24: Education provided on evaluation results and SLP's recommendations. Pt verbalizes agreement with POC, all questions answered to satisfaction. Initiated training re: rehabilitative techniques for aphasia. Recommended asking for more time as needed, using intentional circumlocution as needed to express thoughts and ideas. Will plan to initiate HEP training first therapy session    PATIENT EDUCATION: Education details: POC goals Person educated: Patient and Spouse Education method: Medical illustrator Education comprehension: verbalized understanding and returned  demonstration   GOALS: Goals reviewed with patient? Yes  SHORT TERM GOALS: Target date: 02/05/2024  Pt will complete clinical swallow evaluation first session Baseline: 01/14/24 Goal status: deferred, no complaints   2.  Pt will demonstrate understanding of x3 HEP activities  Baseline:  Goal status: INITIAL  LONG TERM GOALS: Target date: 03/18/24  Pt will complete dysphagia exercises targeting motor planning with mod-I Baseline:  Goal status: INITIAL  2.  Pt will ID communication challenges in home and work environment and with A from SLP, generate solution  Baseline:  Goal status: INITIAL  3.  Pt will complete complex structured language tasks with 90% accuracy  Baseline:  Goal status: INITIAL  4.  Pt will be independent with HEP Baseline:  Goal status: INITIAL  ASSESSMENT:  CLINICAL IMPRESSION: Patient is a 48 y.o. F who was seen today for cognitive linguistic evaluation s/p CVA. Evaluation reveals mild expressive language impairment c/b challenges with word finding. Additional deficits noted with mild impairments in motor speech. Pt reports sense of challenges swallowing saliva. Suspect motor speech and swallow impairments 2/2 motor planning deficit (mild). Swallow to be formally assessed next session. Pt reports frustration with speech and language, strong desire to work towards return to baseline. Will plan to target both speech and language via high level language tasks to aid in return to baseline.   OBJECTIVE IMPAIRMENTS: include expressive language and dysarthria. These impairments are limiting patient from effectively communicating at home and in community. Factors affecting potential to achieve goals and functional outcome are none noted. Patient will benefit from skilled SLP services to address above impairments and improve overall function.  REHAB POTENTIAL: Excellent  PLAN:  SLP FREQUENCY: 1x/week  SLP DURATION: 10 weeks  PLANNED INTERVENTIONS: Language  facilitation, Cueing hierachy, Internal/external aids, Oral motor exercises, Functional tasks, Multimodal communication approach, SLP instruction and feedback, Compensatory strategies, Patient/family education, [phone] Treatment of speech (30 or 45 min) , and [id number]- Speech [address] Artic, Phon, Eval Compre, Express  Alston Jerry, CCC-SLP 01/14/2024, 12:37 PM

## 2024-01-14 NOTE — Telephone Encounter (Signed)
 Called and left message per Dr. Festus Hubert. That a new order was sent in .

## 2024-01-17 LAB — ANA, IFA COMPREHENSIVE PANEL
Anti Nuclear Antibody (ANA): NEGATIVE
ENA SM Ab Ser-aCnc: <1 AI
SM/RNP: <1 AI
SSA (Ro) (ENA) Antibody, IgG: <1 AI
SSB (La) (ENA) Antibody, IgG: <1 AI
Scleroderma (Scl-70) (ENA) Antibody, IgG: <1 AI
ds DNA Ab: 1 [IU]/mL

## 2024-01-17 LAB — ANCA SCREEN W REFLEX TITER: ANCA SCREEN: NEGATIVE

## 2024-01-18 ENCOUNTER — Ambulatory Visit: Payer: Self-pay | Admitting: Neurology

## 2024-01-19 ENCOUNTER — Emergency Department (HOSPITAL_COMMUNITY)

## 2024-01-19 ENCOUNTER — Telehealth: Payer: Self-pay | Admitting: Neurology

## 2024-01-19 ENCOUNTER — Other Ambulatory Visit: Payer: Self-pay

## 2024-01-19 ENCOUNTER — Encounter (HOSPITAL_COMMUNITY): Payer: Self-pay | Admitting: Emergency Medicine

## 2024-01-19 ENCOUNTER — Observation Stay (HOSPITAL_COMMUNITY)
Admission: EM | Admit: 2024-01-19 | Discharge: 2024-01-20 | Disposition: A | Discharge status: Home / Self Care | Attending: Internal Medicine | Admitting: Internal Medicine

## 2024-01-19 DIAGNOSIS — I7771 Dissection of carotid artery: Principal | ICD-10-CM | POA: Insufficient documentation

## 2024-01-19 DIAGNOSIS — Z7982 Long term (current) use of aspirin: Secondary | ICD-10-CM | POA: Insufficient documentation

## 2024-01-19 DIAGNOSIS — Z8679 Personal history of other diseases of the circulatory system: Secondary | ICD-10-CM | POA: Diagnosis not present

## 2024-01-19 DIAGNOSIS — I6930 Unspecified sequelae of cerebral infarction: Secondary | ICD-10-CM

## 2024-01-19 DIAGNOSIS — E785 Hyperlipidemia, unspecified: Secondary | ICD-10-CM | POA: Diagnosis not present

## 2024-01-19 DIAGNOSIS — R2990 Unspecified symptoms and signs involving the nervous system: Principal | ICD-10-CM | POA: Insufficient documentation

## 2024-01-19 DIAGNOSIS — Z8673 Personal history of transient ischemic attack (TIA), and cerebral infarction without residual deficits: Secondary | ICD-10-CM | POA: Diagnosis not present

## 2024-01-19 DIAGNOSIS — Z7901 Long term (current) use of anticoagulants: Secondary | ICD-10-CM | POA: Diagnosis not present

## 2024-01-19 DIAGNOSIS — I10 Essential (primary) hypertension: Secondary | ICD-10-CM | POA: Diagnosis not present

## 2024-01-19 DIAGNOSIS — R479 Unspecified speech disturbances: Secondary | ICD-10-CM | POA: Diagnosis present

## 2024-01-19 HISTORY — DX: Cerebral infarction, unspecified: I63.9

## 2024-01-19 LAB — COMPREHENSIVE METABOLIC PANEL WITH GFR
ALT: 25 U/L (ref 0–44)
AST: 29 U/L (ref 15–41)
Albumin: 4.2 g/dL (ref 3.5–5.0)
Alkaline Phosphatase: 39 U/L (ref 38–126)
Anion gap: 10 (ref 5–15)
BUN: 9 mg/dL (ref 6–20)
CO2: 22 mmol/L (ref 22–32)
Calcium: 9.3 mg/dL (ref 8.9–10.3)
Chloride: 104 mmol/L (ref 98–111)
Creatinine, Ser: 0.76 mg/dL (ref 0.44–1.00)
GFR, Estimated: >60 mL/min (ref >60)
Glucose, Bld: 89 mg/dL (ref 70–99)
Potassium: 3.5 mmol/L (ref 3.5–5.1)
Sodium: 136 mmol/L (ref 135–145)
Total Bilirubin: 0.5 mg/dL (ref 0.0–1.2)
Total Protein: 7 g/dL (ref 6.5–8.1)

## 2024-01-19 LAB — I-STAT CHEM 8, ED
BUN: 8 mg/dL (ref 6–20)
Calcium, Ion: 1.2 mmol/L (ref 1.15–1.40)
Chloride: 101 mmol/L (ref 98–111)
Creatinine, Ser: 0.8 mg/dL (ref 0.44–1.00)
Glucose, Bld: 87 mg/dL (ref 70–99)
HCT: 39 % (ref 36.0–46.0)
Hemoglobin: 13.3 g/dL (ref 12.0–15.0)
Potassium: 3.4 mmol/L — ABNORMAL LOW (ref 3.5–5.1)
Sodium: 138 mmol/L (ref 135–145)
TCO2: 27 mmol/L (ref 22–32)

## 2024-01-19 LAB — CBC
HCT: 40 % (ref 36.0–46.0)
Hemoglobin: 13.3 g/dL (ref 12.0–15.0)
MCH: 28.3 pg (ref 26.0–34.0)
MCHC: 33.3 g/dL (ref 30.0–36.0)
MCV: 85.1 fL (ref 80.0–100.0)
Platelets: 270 10*3/uL (ref 150–400)
RBC: 4.7 MIL/uL (ref 3.87–5.11)
RDW: 13 % (ref 11.5–15.5)
WBC: 6 10*3/uL (ref 4.0–10.5)
nRBC: 0 % (ref 0.0–0.2)

## 2024-01-19 LAB — DIFFERENTIAL
Abs Immature Granulocytes: 0.02 10*3/uL (ref 0.00–0.07)
Basophils Absolute: 0.1 10*3/uL (ref 0.0–0.1)
Basophils Relative: 1 %
Eosinophils Absolute: 0.2 10*3/uL (ref 0.0–0.5)
Eosinophils Relative: 3 %
Immature Granulocytes: 0 %
Lymphocytes Relative: 37 %
Lymphs Abs: 2.2 10*3/uL (ref 0.7–4.0)
Monocytes Absolute: 0.6 10*3/uL (ref 0.1–1.0)
Monocytes Relative: 10 %
Neutro Abs: 3 10*3/uL (ref 1.7–7.7)
Neutrophils Relative %: 49 %

## 2024-01-19 LAB — HCG, SERUM, QUALITATIVE: Preg, Serum: NEGATIVE

## 2024-01-19 LAB — PROTIME-INR
INR: 1 (ref 0.8–1.2)
Prothrombin Time: 12.8 s (ref 11.4–15.2)

## 2024-01-19 MED ORDER — ASPIRIN 81 MG PO TBEC
81.0000 mg | DELAYED_RELEASE_TABLET | Freq: Every day | ORAL | Status: DC
  Administered 2024-01-20: 81 mg via ORAL
  Filled 2024-01-19: qty 1

## 2024-01-19 MED ORDER — CLOPIDOGREL BISULFATE 75 MG PO TABS
75.0000 mg | ORAL_TABLET | Freq: Every day | ORAL | Status: DC
  Administered 2024-01-20: 75 mg via ORAL
  Filled 2024-01-19: qty 1

## 2024-01-19 MED ORDER — METOPROLOL TARTRATE 25 MG PO TABS
25.0000 mg | ORAL_TABLET | Freq: Once | ORAL | Status: AC
  Administered 2024-01-19: 25 mg via ORAL
  Filled 2024-01-19: qty 1

## 2024-01-19 MED ORDER — GABAPENTIN 300 MG PO CAPS
300.0000 mg | ORAL_CAPSULE | Freq: Three times a day (TID) | ORAL | Status: DC
  Administered 2024-01-20 (×2): 300 mg via ORAL
  Filled 2024-01-19 (×2): qty 1

## 2024-01-19 MED ORDER — NITROGLYCERIN 2 % TD OINT
1.0000 [in_us] | TOPICAL_OINTMENT | Freq: Once | TRANSDERMAL | Status: AC
  Administered 2024-01-19: 1 [in_us] via TOPICAL
  Filled 2024-01-19: qty 1

## 2024-01-19 NOTE — Consult Note (Signed)
 NEUROLOGY CONSULT NOTE   Date of service: Jan 19, 2024 Patient Name: Patty [name] MRN:  [id number] DOB:  [birth year] Chief Complaint: "Abnormal repeat CTA" Requesting Provider: Iva Mariner, MD  History of Present Illness  Patty [name] is a 48 y.o. 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 She also was informed today by her outpatient Neurologist, Dr. Festus Hubert, of the results of her repeat CTA which revealed a new dissection involving the right ICA and a possible new segment of dissection in the left ICA. She has been continuing the ASA and Plavix . Symptoms of left facial numbness and frequent swallowing are also endorsed by the patient. She continues to have mild residual word-finding deficits.   She had initially presented on 5/11 at which time she was diagnosed with a stroke and left ICA dissection. She discharged on 5/13. Time course of successive imaging studies is documented above the assessment/plan section of this note, below. She was discharged at that time on ASA and Plavix .   Initial presentation last admission as documented in Dr. Mollie Anger discharge summary from 5/13 has been reviewed: " Patty [name] is a 48 y.o. female with hx of chronic back pain, sacroiliac pain who presents with aphemia. LKW 1600. She was at home having a normal day. She walked into the room where her husband was and was tearful and sobbing. Repetitive swallowing. Husband attempted stroke scale and could not get her to speak. Called 911. EMS arrived. Patient had no other deficit. EMS were on the scene for 20 misn with no significant speech improvement. She was standing up, able to walk by herself and had no other deficit.   She was activated and brought in as a code stroke. Some improvement enroute and able to speak in a short phrase or single word. On my evaluation,  slight hyperventilation. Appears tearful. She is able to mouth words initially, able to speak a few words. She is able to name thumb, pinky finger, glove, identify colors, able to identify objects and aphasia pictures. She is oriented to her age and month and year. NIHSS is a 0 despite very limited speech output. She was initially not offered tnkase  as her symptoms were felt to be more concerning for globus sensation in the setting of a potential panic attack. However, husband mentioned that this is very unlike her and she has never had this in the past in the setting of a panic attack. Patient noted to be able to write without any difficulties. She was taken for a STAT MRI Brain then which demonstrated diffusion restriction in the left frontal operculum concerning for an acute stroke. Despite her NIHSS of 0, significantly limited speech output secondary to aphemia was felt to be disabling and she was offered tnkase . I had extensive discussion with patient and her husband about tnkase  including 30% chance of improvement and about 3-5% risk of ICH which can worsen her symptoms and even result in death. Patient and family consented to tnkase . Pharmacy mixed tnkase  and it was given to patient at 1826.[name] Patient was taken for STAT CT Angio of the head and neck which was notable for irregularity and severe narrowing of the mid and distal left ICA concerning for a dissection with approximately 1 to 2 mm segment occlusion of the distal left cer focal ICA and then focal occlusion of left petrous ICA with reconstitution  and good flow in the distal left cavernous and supraclinoid ICA.  Rest of the arterial vasculature including the left MCA branches are intact with no occlusion. CT perfusion with no mismatch. Upon further history from husband, patient had been reporting a whooshing sound in her left ear for the last 2 to 3 days.  Patient also reports a recent visit to hair salon with her head hyperextended onto the sink for  an extended period of time.  Also reports a visit to chiropractor a couple weeks ago but did not have any chiropractic neck manipulation."    ROS  Comprehensive ROS performed and pertinent positives documented in HPI    Past History   Past Medical History:  Diagnosis Date   Chronic back pain    officially undiagnosed   Stroke [facility])     History reviewed. No pertinent surgical history.  Family History: History reviewed. No pertinent family history.  Social History  reports that she has never smoked. She has never used smokeless tobacco. She reports current alcohol use of about 1.0 standard drink of alcohol per week. She reports that she does not use drugs.  No Known Allergies  Medications  No current facility-administered medications for this encounter.  Current Outpatient Medications:    aspirin  EC 81 MG tablet, Take 1 tablet (81 mg total) by mouth daily. Swallow whole., Disp: 30 tablet, Rfl: 12   atorvastatin  (LIPITOR) 40 MG tablet, Take 1 tablet (40 mg total) by mouth daily., Disp: 30 tablet, Rfl: 2   butalbital -acetaminophen -caffeine  (FIORICET ) 50-325-40 MG tablet, Take 1 tablet by mouth every 8 (eight) hours as needed for headache. (Patient not taking: Reported on 01/11/2024), Disp: 14 tablet, Rfl: 0   cetirizine (ZYRTEC) 10 MG tablet, Take 10 mg by mouth at bedtime., Disp: , Rfl:    clopidogrel  (PLAVIX ) 75 MG tablet, Take 1 tablet (75 mg total) by mouth daily., Disp: 30 tablet, Rfl: 2   fluticasone (FLONASE) 50 MCG/ACT nasal spray, Place 1 spray into both nostrils daily. (Patient not taking: Reported on 01/11/2024), Disp: , Rfl:    gabapentin  (NEURONTIN ) 300 MG capsule, Take 1 capsule (300 mg total) by mouth 3 (three) times daily., Disp: 90 capsule, Rfl: 5   UNKNOWN TO PATIENT, Apply 1 Application topically daily as needed (Cold Sore). Expired RX topical., Disp: , Rfl:    venlafaxine  XR (EFFEXOR -XR) 37.5 MG 24 hr capsule, TAKE 1 CAPSULE BY MOUTH DAILY WITH BREAKFAST., Disp: 90  capsule, Rfl: 1  Vitals   Vitals:   01/19/24 2115 01/19/24 2130 01/19/24 2138 01/19/24 2145  BP: (!) 174/92 (!) 169/88 (!) 168/88 (!) 163/95  Pulse: 65 65 74 62  Resp: 18 14  14   Temp:      TempSrc:      SpO2: 100% 100%  100%  Weight:        Body mass index is 22.42 kg/m.  Physical Exam   Physical Exam  HEENT-  [place]/AT. No evidence of neck trauma.     Lungs- Respirations unlabored Extremities- No edema  Neurological Examination Mental Status: Alert, oriented x 5, thought content appropriate.  Speech fluent without evidence of aphasia.  No word-finding deficits noted. Able to follow all commands without difficulty. Cranial Nerves: II: Temporal visual fields intact with no extinction to DSS. PERRL without any evidence for miosis.  III,IV, VI: No ptosis. EOMI. No nystagmus.  V: Temp sensation with mild hyperesthesia in left V2 distribution.    VII: Smile symmetric VIII: Hearing intact to voice IX,X: No  hoarseness. Palate elevates normally.  XI: Symmetric shoulder shrug XII: Midline tongue extension Motor: BUE 5/5 proximally and distally BLE 5/5 proximally and distally  No pronator drift.  Sensory: Temp and FT sensation normal to BUE.  FT sensation mildly decreased to LLE, normal to RLE. No extinction to DSS.  Deep Tendon Reflexes: 2+ and symmetric bilateral biceps, brachioradialis, patellae and achilles. Toes downgoing bilaterally.  Cerebellar: No ataxia with FNF or H-S bilaterally  Gait: Deferred   Labs/Imaging/Neurodiagnostic studies   CBC:  Recent Labs  Lab 2024/01/22 2112 Jan 22, 2024 2135  WBC 6.0  --   NEUTROABS 3.0  --   HGB 13.3 13.3  HCT 40.0 39.0  MCV 85.1  --   PLT 270  --    Basic Metabolic Panel:  Lab Results  Component Value Date   NA 138 01-22-2024   K 3.4 (L) Jan 22, 2024   CO2 21 (L) 01/02/2024   GLUCOSE 87 2024-01-22   BUN 8 01/22/24   CREATININE 0.80 01/22/2024   CALCIUM  9.6 01/02/2024   GFRNONAA >60 01/02/2024   GFRAA >60 02/11/2015    Lipid Panel:  Lab Results  Component Value Date   LDLCALC 96 01/03/2024   HgbA1c:  Lab Results  Component Value Date   HGBA1C 4.9 01/03/2024   Urine Drug Screen:     Component Value Date/Time   LABOPIA NONE DETECTED 01/02/2024 2353   COCAINSCRNUR NONE DETECTED 01/02/2024 2353   LABBENZ NONE DETECTED 01/02/2024 2353   AMPHETMU NONE DETECTED 01/02/2024 2353   THCU NONE DETECTED 01/02/2024 2353   LABBARB NONE DETECTED 01/02/2024 2353    Alcohol Level     Component Value Date/Time   ETH <15 01/02/2024 1710   INR  Lab Results  Component Value Date   INR 1.0 2024/01/22   APTT  Lab Results  Component Value Date   APTT 26 01/02/2024   Prior scans:  MRI brain (01/02/24): - Focal acute infarct involving cortex within the left frontal operculum.  - Abnormal left ICA flow void compatible with findings of dissection on CTA.  MRI brain (01/04/24): 1. New, small focus of acute ischemia within the left frontal lobe, slightly superior to the previously demonstrated location within the left frontal operculum. No hemorrhage or mass effect. 2. Abnormal left ICA flow void, unchanged. This remains concerning for dissection  CTA of head and neck with CTP (01/02/24): - Irregularity and severe narrowing of the mid and distal left cervical ICA concerning for dissection. There is a short approximately 1-2 mm segment of occlusion of the distal left cervical ICA with reconstitution proximal to the skull base.  - Additional significant irregularity and likely focal occlusion at the posterior vertical portion of the left petrous ICA. Reconstitution of the horizontal left petrous ICA which is diminutive in caliber and irregularity concerning for intracranial propagation of dissection. - Diminished intraluminal contrast within the left cavernous and left supraclinoid ICA without significant irregularity. - Arterial vasculature in the head and neck is otherwise patent. - No core infarct  identified on CT perfusion.  CTA of head and neck (01/13/24): - Improved patency of the left cervical ICA. The previously occluded short segment of the distal left cervical ICA is patent on the current study. Residual irregularity of the mid and distal left cervical ICA with foci of moderate stenosis. - Improved patency of the posterior vertical portion of the left petrous ICA with residual focus of moderate to severe stenosis at the skull base. There is a new focus of soft tissue  along the horizontal portion of the left petrous ICA concerning for propagation of dissection versus adherent mural thrombus which results in moderate stenosis. - New mild irregularity and stenosis of the mid and distal right cervical ICA concerning for dissection. There is no evidence of intracranial propagation. - Intracranial arterial vasculature otherwise patent and normal in caliber.  - No CT evidence of acute intracranial abnormality.      ASSESSMENT  Patty [name] is a 47 y.o. 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 She also was informed today by her outpatient Neurologist, Dr. Festus Hubert, of the results of her repeat CTA which revealed a new dissection involving the right ICA and a possible new segment of dissection in the left ICA. She has been continuing the ASA and Plavix . Symptoms of left facial numbness and frequent swallowing are also endorsed by the patient. She continues to have mild residual word-finding deficits.  - Exam reveals mild hyperesthesia in left V2 distribution and FT sensation mildly decreased to LLE. - MRI brain from today: Read as normal by Radiology, but subtle cortical FLAIR  hyperintensity at the location of the previously imaged small left frontal lobe acute infarction is appreciated on review of the images by Neurology.  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 A vasculitis is also possible.  - It should be noted that, per the literature, without a predisposing condition the aetiology of spontaneous carotid artery dissection remains poorly understood. Structural defects of the underlying extracellular matrix due to a combination of both environmental and genetic factors are usually suspected.   RECOMMENDATIONS  - Will need to consult Dr. Alvira Josephs of Interventional Neuroradiology in the AM.  - Continue ASA and Plavix .  - No sudden or sharp neck movements, as well as no hyperflexion, hyperrotation or hyperextension neck movements, indefinitely. This would include cessation of activities such as amusement park rides or chiropractic manipulations of the neck. The patient expressed understanding and agreement.  - Stroke Team to follow ______________________________________________________________________    Hope Ly, Majestic Molony, MD [facility]

## 2024-01-19 NOTE — ED Provider Notes (Signed)
 [place] EMERGENCY DEPARTMENT AT [facility] Provider Note   CSN: [id number] Arrival date & time: 01/19/24  1954     History {Add pertinent medical, surgical, social history, OB history to HPI:1} Chief Complaint  Patient presents with   Hypertension   Headache    Patty [name] is a 48 y.o. female.   Hypertension Associated symptoms include headaches.  Headache Associated symptoms: dizziness and numbness   Patient presents for strokelike symptoms.  Medical history includes CVA, left ICA dissection.  2 and half weeks ago, she presented to the emergency department with speech difficulty.  Imaging of time showed concern of dissection and CVA.  She was given TNKase .  She was started on DAPT.  She was discharged 2 days later.  Since her hospital discharge, she has had ongoing whooshing sound in her ears.  She underwent repeat CTA a week ago.  Since then, she has had some difficulty swallowing over the past several days, numbness and tingling to the left side of her face.  She has been monitoring her blood pressure at home and it has been persistently elevated in the 140s to 150s SBP.  She was called today to inform her of her CTA results.  Results showed improved patency of previously occluded short segment of distal left cervical ICA and improved patency of posterior vertical portion of left petrous ICA.  There was a new focus of soft tissue along horizontal portion of left petrous ICA concerning for propagation of dissection versus mural thrombus.  There was also a new mild irregularity and stenosis of mid and distal right cervical ICA concerning for dissection.     Home Medications Prior to Admission medications   Medication Sig Start Date End Date Taking? Authorizing Provider  aspirin  EC 81 MG tablet Take 1 tablet (81 mg total) by mouth daily. Swallow whole. 01/05/24   Consuelo Denmark, MD  atorvastatin  (LIPITOR) 40 MG tablet Take 1 tablet (40 mg total) by mouth daily. 01/05/24    Consuelo Denmark, MD  butalbital -acetaminophen -caffeine  (FIORICET ) 50-325-40 MG tablet Take 1 tablet by mouth every 8 (eight) hours as needed for headache. Patient not taking: Reported on 01/11/2024 01/04/24   Consuelo Denmark, MD  cetirizine (ZYRTEC) 10 MG tablet Take 10 mg by mouth at bedtime.    [provider]  clopidogrel  (PLAVIX ) 75 MG tablet Take 1 tablet (75 mg total) by mouth daily. 01/05/24   Consuelo Denmark, MD  fluticasone (FLONASE) 50 MCG/ACT nasal spray Place 1 spray into both nostrils daily. Patient not taking: Reported on 01/11/2024    [provider]  gabapentin  (NEURONTIN ) 300 MG capsule Take 1 capsule (300 mg total) by mouth 3 (three) times daily. 01/14/24   Festus Hubert, Adam R, DO  UNKNOWN TO PATIENT Apply 1 Application topically daily as needed (Cold Sore). Expired RX topical.    [provider]  venlafaxine  XR (EFFEXOR -XR) 37.5 MG 24 hr capsule TAKE 1 CAPSULE BY MOUTH DAILY WITH BREAKFAST. 10/25/23   Smith, Zachary M, DO      Allergies    Patient has no known allergies.    Review of Systems   Review of Systems  HENT:  Positive for trouble swallowing.   Neurological:  Positive for dizziness, numbness and headaches.  All other systems reviewed and are negative.   Physical Exam Updated Vital Signs BP (!) 155/99   Pulse 79   Temp 98 F (36.7 C) (Oral)   Resp 20   Wt 63 kg   LMP 01/05/2024 (  Approximate)   SpO2 100%   BMI 22.42 kg/m  Physical Exam Vitals and nursing note reviewed.  Constitutional:      General: She is not in acute distress.    Appearance: She is well-developed. She is not ill-appearing, toxic-appearing or diaphoretic.  HENT:     Head: Normocephalic and atraumatic.     Mouth/Throat:     Mouth: Mucous membranes are moist.  Eyes:     Extraocular Movements: Extraocular movements intact.     Conjunctiva/sclera: Conjunctivae normal.  Cardiovascular:     Rate and Rhythm: Normal rate and regular rhythm.  Pulmonary:     Effort: Pulmonary  effort is normal. No respiratory distress.  Abdominal:     General: There is no distension.     Palpations: Abdomen is soft.  Musculoskeletal:        General: No swelling. Normal range of motion.     Cervical back: Normal range of motion and neck supple.  Skin:    General: Skin is warm and dry.     Capillary Refill: Capillary refill takes less than 2 seconds.  Neurological:     Mental Status: She is alert and oriented to person, place, and time.     Cranial Nerves: No cranial nerve deficit, dysarthria or facial asymmetry.     Sensory: Sensory deficit (Endorses some diminished sensation in left hemibody and left face (V2/V3)) present.     Motor: No weakness.     Coordination: Coordination normal.  Psychiatric:        Mood and Affect: Mood normal.        Behavior: Behavior normal.     ED Results / Procedures / Treatments   Labs (all labs ordered are listed, but only abnormal results are displayed) Labs Reviewed - No data to display  EKG None  Radiology No results found.  Procedures Procedures  {Document cardiac monitor, telemetry assessment procedure when appropriate:1}  Medications Ordered in ED Medications - No data to display  ED Course/ Medical Decision Making/ A&P   {   Click here for ABCD2, HEART and other calculatorsREFRESH Note before signing :1}                              Medical Decision Making Amount and/or Complexity of Data Reviewed Labs: ordered.  Risk Prescription drug management.   This patient presents to the ED for concern of ***, this involves an extensive number of treatment options, and is a complaint that carries with it a high risk of complications and morbidity.  The differential diagnosis includes ***   Co morbidities / Chronic conditions that complicate the patient evaluation  ***   Additional history obtained:  Additional history obtained from EMR External records from outside source obtained and reviewed including  ***   Lab Tests:  I Ordered, and personally interpreted labs.  The pertinent results include:  ***   Imaging Studies ordered:  I ordered imaging studies including ***  I independently visualized and interpreted imaging which showed *** I agree with the radiologist interpretation   Cardiac Monitoring: / EKG:  The patient was maintained on a cardiac monitor.  I personally viewed and interpreted the cardiac monitored which showed an underlying rhythm of: ***   Problem List / ED Course / Critical interventions / Medication management  Patient presenting for recent symptoms including difficulty swallowing, dizziness, numbness and tingling to left side of face.  She was also called today  to inform her of new findings on CTA that was performed outpatient 1 week ago.  She had stroke and dissection 2-1/2 weeks ago.  Repeat CTA showed concern of extension of dissections. I ordered medication including ***   Reevaluation of the patient after these medicines showed that the patient *** I have reviewed the patients home medicines and have made adjustments as needed   Consultations Obtained:  I requested consultation with the ***,  and discussed lab and imaging findings as well as pertinent plan - they recommend: ***   Social Determinants of Health:  ***   Test / Admission - Considered:  ***   {Document critical care time when appropriate:1} {Document review of labs and clinical decision tools ie heart score, Chads2Vasc2 etc:1}  {Document your independent review of radiology images, and any outside records:1} {Document your discussion with family members, caretakers, and with consultants:1} {Document social determinants of health affecting pt's care:1} {Document your decision making why or why not admission, treatments were needed:1} Final Clinical Impression(s) / ED Diagnoses Final diagnoses:  None    Rx / DC Orders ED Discharge Orders     None

## 2024-01-19 NOTE — Telephone Encounter (Signed)
 I spoke with Patty [name] regarding repeat CTA.  There does appear to be a new dissection involving the right ICA and there may be a new segment of dissection in the left ICA.  She has been continuing the ASA and Plavix .  I would like to refer her to Dr. Luellen Sages of endovascular radiology for consultation.   She has seen speech therapy last week but notes increased trouble with swallowing over the past 2 days.  I would like to order a Modified Barium Swallow and will send a message to her speech therapist, Berdie Breach, as well.    I again stressed no strenuous activity, jarring motions, quick neck turns, lifting anything over 15 lbs.

## 2024-01-19 NOTE — ED Provider Triage Note (Signed)
 Emergency Medicine Provider Triage Evaluation Note  Patty [name] , a 48 y.o. female  was evaluated in triage.  Pt complains of high blood pressure, had stroke due to carotid artery dissection recently, had repeat CTA that showed a new dissection, her neurologist was going to refer her outpatient to interventional neuroradiology,, and have barium swallow study.  She called back when she notes her blood pressure have been consistently elevated over the 130/80 he had suggested in the on-call provider told him to come to the ER..  Review of Systems  Positive:  Negative:   Physical Exam  BP (!) 155/99   Pulse 79   Temp 98 F (36.7 C) (Oral)   Resp 20   Wt 63 kg   LMP 01/05/2024 (Approximate)   SpO2 100%   BMI 22.42 kg/m  Gen:   Awake, no distress   Resp:  Normal effort  MSK:   Moves extremities without difficulty  Other:    Medical Decision Making  Medically screening exam initiated at 8:29 PM.  Appropriate orders placed.  Patty [name] was informed that the remainder of the evaluation will be completed by another provider, this initial triage assessment does not replace that evaluation, and the importance of remaining in the ED until their evaluation is complete.     Patty [name], [place] 01/19/24 2031

## 2024-01-19 NOTE — ED Triage Notes (Signed)
 PT BIB spouse from home. Pt has been having headaches, increased bp, sore throat and difficulty swallowing for almost 1 week. Pt was discharged 2 weeks ago from [facility] after having stroke. PT had CT scan last week and received call today started multiple dissections in neck.

## 2024-01-20 ENCOUNTER — Telehealth: Payer: Self-pay | Admitting: Neurology

## 2024-01-20 DIAGNOSIS — Z8673 Personal history of transient ischemic attack (TIA), and cerebral infarction without residual deficits: Secondary | ICD-10-CM | POA: Diagnosis not present

## 2024-01-20 DIAGNOSIS — I7771 Dissection of carotid artery: Secondary | ICD-10-CM

## 2024-01-20 DIAGNOSIS — I63032 Cerebral infarction due to thrombosis of left carotid artery: Secondary | ICD-10-CM

## 2024-01-20 DIAGNOSIS — R030 Elevated blood-pressure reading, without diagnosis of hypertension: Secondary | ICD-10-CM

## 2024-01-20 DIAGNOSIS — Z8679 Personal history of other diseases of the circulatory system: Secondary | ICD-10-CM | POA: Diagnosis not present

## 2024-01-20 DIAGNOSIS — I6930 Unspecified sequelae of cerebral infarction: Secondary | ICD-10-CM | POA: Diagnosis not present

## 2024-01-20 DIAGNOSIS — I10 Essential (primary) hypertension: Secondary | ICD-10-CM | POA: Diagnosis not present

## 2024-01-20 DIAGNOSIS — E785 Hyperlipidemia, unspecified: Secondary | ICD-10-CM | POA: Diagnosis present

## 2024-01-20 LAB — CBC WITH DIFFERENTIAL/PLATELET
Abs Immature Granulocytes: 0.01 10*3/uL (ref 0.00–0.07)
Basophils Absolute: 0.1 10*3/uL (ref 0.0–0.1)
Basophils Relative: 1 %
Eosinophils Absolute: 0.2 10*3/uL (ref 0.0–0.5)
Eosinophils Relative: 3 %
HCT: 37.6 % (ref 36.0–46.0)
Hemoglobin: 12.4 g/dL (ref 12.0–15.0)
Immature Granulocytes: 0 %
Lymphocytes Relative: 37 %
Lymphs Abs: 2.1 10*3/uL (ref 0.7–4.0)
MCH: 28.2 pg (ref 26.0–34.0)
MCHC: 33 g/dL (ref 30.0–36.0)
MCV: 85.5 fL (ref 80.0–100.0)
Monocytes Absolute: 0.6 10*3/uL (ref 0.1–1.0)
Monocytes Relative: 10 %
Neutro Abs: 2.8 10*3/uL (ref 1.7–7.7)
Neutrophils Relative %: 49 %
Platelets: 253 10*3/uL (ref 150–400)
RBC: 4.4 MIL/uL (ref 3.87–5.11)
RDW: 13.1 % (ref 11.5–15.5)
WBC: 5.7 10*3/uL (ref 4.0–10.5)
nRBC: 0 % (ref 0.0–0.2)

## 2024-01-20 LAB — COMPREHENSIVE METABOLIC PANEL WITH GFR
ALT: 21 U/L (ref 0–44)
AST: 26 U/L (ref 15–41)
Albumin: 3.7 g/dL (ref 3.5–5.0)
Alkaline Phosphatase: 38 U/L (ref 38–126)
Anion gap: 8 (ref 5–15)
BUN: 8 mg/dL (ref 6–20)
CO2: 24 mmol/L (ref 22–32)
Calcium: 8.8 mg/dL — ABNORMAL LOW (ref 8.9–10.3)
Chloride: 104 mmol/L (ref 98–111)
Creatinine, Ser: 0.69 mg/dL (ref 0.44–1.00)
GFR, Estimated: >60 mL/min (ref >60)
Glucose, Bld: 80 mg/dL (ref 70–99)
Potassium: 3.9 mmol/L (ref 3.5–5.1)
Sodium: 136 mmol/L (ref 135–145)
Total Bilirubin: 0.7 mg/dL (ref 0.0–1.2)
Total Protein: 6.3 g/dL — ABNORMAL LOW (ref 6.5–8.1)

## 2024-01-20 LAB — MAGNESIUM: Magnesium: 2 mg/dL (ref 1.7–2.4)

## 2024-01-20 MED ORDER — ALBUTEROL SULFATE (2.5 MG/3ML) 0.083% IN NEBU: 2.5000 mg | INHALATION_SOLUTION | Freq: Four times a day (QID) | RESPIRATORY_TRACT | Status: DC | PRN

## 2024-01-20 MED ORDER — METOPROLOL TARTRATE 25 MG PO TABS: 12.5000 mg | ORAL_TABLET | Freq: Two times a day (BID) | ORAL | 1 refills | Status: AC

## 2024-01-20 MED ORDER — HYDRALAZINE HCL 20 MG/ML IJ SOLN: 10.0000 mg | INTRAMUSCULAR | Status: DC | PRN

## 2024-01-20 MED ORDER — SODIUM CHLORIDE 0.9% FLUSH
3.0000 mL | Freq: Two times a day (BID) | INTRAVENOUS | Status: DC
  Administered 2024-01-20: 3 mL via INTRAVENOUS

## 2024-01-20 MED ORDER — ACETAMINOPHEN 325 MG PO TABS
650.0000 mg | ORAL_TABLET | Freq: Four times a day (QID) | ORAL | Status: DC | PRN
Start: 2024-01-20 — End: 2024-01-20
  Administered 2024-01-20: 650 mg via ORAL
  Filled 2024-01-20: qty 2

## 2024-01-20 MED ORDER — FENTANYL CITRATE PF 50 MCG/ML IJ SOSY: 25.0000 ug | PREFILLED_SYRINGE | INTRAMUSCULAR | Status: DC | PRN

## 2024-01-20 MED ORDER — METOPROLOL TARTRATE 25 MG PO TABS
12.5000 mg | ORAL_TABLET | Freq: Two times a day (BID) | ORAL | Status: DC
  Administered 2024-01-20: 12.5 mg via ORAL
  Filled 2024-01-20: qty 1

## 2024-01-20 MED ORDER — MAGNESIUM OXIDE -MG SUPPLEMENT 400 (240 MG) MG PO TABS: 400.0000 mg | ORAL_TABLET | Freq: Every day | ORAL | Status: DC

## 2024-01-20 MED ORDER — ACETAMINOPHEN 650 MG RE SUPP: 650.0000 mg | Freq: Four times a day (QID) | RECTAL | Status: DC | PRN

## 2024-01-20 MED ORDER — ATORVASTATIN CALCIUM 40 MG PO TABS
40.0000 mg | ORAL_TABLET | Freq: Every day | ORAL | Status: DC
  Filled 2024-01-20: qty 1

## 2024-01-20 MED ORDER — ONDANSETRON HCL 4 MG/2ML IJ SOLN: 4.0000 mg | Freq: Four times a day (QID) | INTRAMUSCULAR | Status: DC | PRN

## 2024-01-20 MED ORDER — NALOXONE HCL 0.4 MG/ML IJ SOLN: 0.4000 mg | INTRAMUSCULAR | Status: DC | PRN

## 2024-01-20 MED ORDER — VENLAFAXINE HCL ER 37.5 MG PO CP24
37.5000 mg | ORAL_CAPSULE | Freq: Every day | ORAL | Status: DC
  Administered 2024-01-20: 37.5 mg via ORAL
  Filled 2024-01-20 (×2): qty 1

## 2024-01-20 MED ORDER — POTASSIUM CHLORIDE CRYS ER 20 MEQ PO TBCR
40.0000 meq | EXTENDED_RELEASE_TABLET | Freq: Once | ORAL | Status: AC
  Administered 2024-01-20: 40 meq via ORAL
  Filled 2024-01-20: qty 2

## 2024-01-20 MED ORDER — ENOXAPARIN SODIUM 40 MG/0.4ML IJ SOSY: 40.0000 mg | PREFILLED_SYRINGE | INTRAMUSCULAR | Status: DC

## 2024-01-20 MED ORDER — ATORVASTATIN CALCIUM 40 MG PO TABS: 40.0000 mg | ORAL_TABLET | Freq: Every evening | ORAL | Status: DC

## 2024-01-20 NOTE — ED Notes (Signed)
 Neuro at the bedside.

## 2024-01-20 NOTE — Telephone Encounter (Signed)
 Pt's husband called in and left a message. He wants to see if he or the patient can speak with Dr. Festus Hubert before the pt is discharged from the hospital.

## 2024-01-20 NOTE — ED Notes (Signed)
 Effexor  not yet received from pharmacy. Delay in administration reference same.

## 2024-01-20 NOTE — Progress Notes (Addendum)
 STROKE TEAM PROGRESS NOTE   SUBJECTIVE (INTERVAL HISTORY) Her husband is at the bedside.  Patient sitting at edge of bed, no specific neurologic concerns.  They stated that last week after patient went back to work part-time, she had 1 episode of feeling left corner of mouth tingling.  Continue to hear whooshing sound bilateral ear.  Mild neck pain with neck extension.  She follows with Dr. 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  BP also elevated at home, was given metoprolol  in ED yesterday  OBJECTIVE Temp:  [98 F (36.7 C)-98.2 F (36.8 C)] 98 F (36.7 C) (05/29 1333) Pulse Rate:  [50-79] 66 (05/29 1257) Cardiac Rhythm: Normal sinus rhythm (05/29 0119) Resp:  [13-20] 14 (05/29 1257) BP: (112-174)/(66-99) 121/83 (05/29 1257) SpO2:  [97 %-100 %] 100 % (05/29 1257) Weight:  [16 kg] 63 kg (05/28 2009)  No results for input(s): "GLUCAP" in the last 168 hours.  Recent Labs  Lab 01/19/24 2112 01/19/24 2135 01/20/24 0414  NA 136 138 136  K 3.5 3.4* 3.9  CL 104 101 104  CO2 22  --  24  GLUCOSE 89 87 80  BUN 9 8 8   CREATININE 0.76 0.80 0.69  CALCIUM  9.3  --  8.8*  MG  --   --  2.0   Recent Labs  Lab 01/19/24 2112 01/20/24 0414  AST 29 26  ALT 25 21  ALKPHOS 39 38  BILITOT 0.5 0.7  PROT 7.0 6.3*  ALBUMIN 4.2 3.7   Recent Labs  Lab 01/19/24 2112 01/19/24 2135 01/20/24 0414  WBC 6.0  --  5.7  NEUTROABS 3.0  --  2.8  HGB 13.3 13.3 12.4  HCT 40.0 39.0 37.6  MCV 85.1  --  85.5  PLT 270  --  253   No results for input(s): "CKTOTAL", "CKMB", "CKMBINDEX", "TROPONINI" in the last 168 hours. Recent Labs    01/19/24 2112  LABPROT 12.8  INR 1.0   No results for input(s): "COLORURINE", "LABSPEC", "PHURINE", "GLUCOSEU", "HGBUR", "BILIRUBINUR", "KETONESUR", "PROTEINUR", "UROBILINOGEN", "NITRITE", "LEUKOCYTESUR" in the last 72 hours.  Invalid input(s): "APPERANCEUR"     Component  Value Date/Time   CHOL 168 01/03/2024 0601   TRIG 39 01/03/2024 0601   HDL 64 01/03/2024 0601   CHOLHDL 2.6 01/03/2024 0601   VLDL 8 01/03/2024 0601   LDLCALC 96 01/03/2024 0601   Lab Results  Component Value Date   HGBA1C 4.9 01/03/2024      Component Value Date/Time   LABOPIA NONE DETECTED 01/02/2024 2353   COCAINSCRNUR NONE DETECTED 01/02/2024 2353   LABBENZ NONE DETECTED 01/02/2024 2353   AMPHETMU NONE DETECTED 01/02/2024 2353   THCU NONE DETECTED 01/02/2024 2353   LABBARB NONE DETECTED 01/02/2024 2353    No results for input(s): "ETH" in the last 168 hours.   I have personally reviewed the radiological images below and agree with the radiology interpretations.  MR BRAIN WO CONTRAST Result Date: 01/19/2024 CLINICAL DATA:  Acute neurologic deficit EXAM: MRI HEAD WITHOUT CONTRAST TECHNIQUE: Multiplanar, multiecho pulse sequences of the brain and surrounding structures were obtained without intravenous contrast. COMPARISON:  01/03/2024 FINDINGS: Brain: No acute infarct, mass effect or extra-axial collection. No acute or chronic hemorrhage. Normal white matter signal, parenchymal volume and CSF spaces. The midline structures are normal. Vascular: Normal flow voids. Skull and upper cervical spine: Normal calvarium and skull base. Visualized upper cervical spine and soft tissues are  normal. Sinuses/Orbits:No paranasal sinus fluid levels or advanced mucosal thickening. No mastoid or middle ear effusion. Normal orbits. IMPRESSION: Normal brain MRI. Electronically Signed   By: Juanetta Nordmann M.D.   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 EXAM: CT ANGIOGRAPHY HEAD AND NECK WITH AND WITHOUT CONTRAST TECHNIQUE: Multidetector CT imaging of the head and neck was performed using the standard protocol during bolus administration of intravenous contrast. Multiplanar CT image  reconstructions and MIPs were obtained to evaluate the vascular anatomy. Carotid stenosis measurements (when applicable) are obtained utilizing NASCET criteria, using the distal internal carotid diameter as the denominator. RADIATION DOSE REDUCTION: This exam was performed according to the departmental dose-optimization program which includes automated exposure control, adjustment of the mA and/or kV according to patient size and/or use of iterative reconstruction technique. CONTRAST:  75mL ISOVUE-370 IOPAMIDOL (ISOVUE-370) INJECTION 76% COMPARISON:  CT head and CTA head and neck 01/02/2024. MRI head 01/03/2024 and earlier. FINDINGS: CT HEAD FINDINGS Brain: No acute intracranial hemorrhage. Previously noted small foci of infarct in the left frontal lobe are not well visualized on the current study. No edema, mass effect, or midline shift. The basilar cisterns are patent. Posterior fossa is unremarkable. No extra-axial fluid collections. Ventricles: Ventricles are normal in size and configuration. Vascular: No hyperdense vessel. Skull: No acute or aggressive finding. Sinuses/orbits: The visualized paranasal sinuses are clear. Orbits are symmetric. Other: Mastoid air cells are clear. CTA NECK FINDINGS Aortic arch: Standard configuration of the aortic arch. Imaged portion shows no evidence of aneurysm or dissection. No significant stenosis of the major arch vessel origins. Pulmonary arteries: As permitted by contrast timing, there are no filling defects in the visualized pulmonary arteries. Subclavian arteries: The subclavian arteries are patent bilaterally. Right carotid system: Patent from the origin to the skull base. The carotid bifurcation is widely patent without significant atherosclerosis. External carotid artery branches are patent. There is mild irregularity of the vessel with associated mild luminal narrowing which is new from prior for example noted on axial image 144. Left carotid system: Patent from the  origin to the skull base. The carotid bifurcation is widely patent without significant atherosclerosis. External carotid artery branches are patent. There is improved patency of the internal carotid artery. Mild irregularity of the mid and distal cervical ICA is improved compared to the prior study. There are residual foci of moderate narrowing for example axial images 147, 165. Vertebral arteries: Codominant. No evidence of dissection, stenosis (50% or greater), or occlusion. Skeleton: No acute findings. Degenerative changes in the cervical spine most pronounced at C5-6. Other neck: The visualized airway is patent. No cervical lymphadenopathy. Upper chest: Visualized lung apices are clear. Review of the MIP images confirms the above findings CTA HEAD FINDINGS ANTERIOR CIRCULATION: The intracranial internal carotid arteries are patent. There is irregularity of the posterior vertical portion of the left petrous ICA is improved from the prior study. There is no further occlusion identified within the left petrous ICA. Focal moderate to severe stenosis of the proximal left petrous ICA. There is additional irregularity along the horizontal portion of the left petrous ICA with soft tissue along the vessel wall which may reflect sequelae of dissection versus adherent mural thrombus. There is associated moderate luminal narrowing. The left ICA is otherwise patent and similar in caliber to the prior study. The right ICA demonstrates irregularity and focal moderate narrowing just proximal  to the skull base which is new from prior. The intracranial ICA is otherwise patent and normal in caliber. MCAs: The middle cerebral arteries are patent bilaterally. ACAs: The anterior cerebral arteries are patent bilaterally. POSTERIOR CIRCULATION: No significant stenosis, proximal occlusion, aneurysm, or vascular malformation. PCAs: The posterior cerebral arteries are patent bilaterally. Pcomm: Not well visualized. SCAs: The superior  cerebellar arteries are patent bilaterally. Basilar artery: Patent AICAs: Patent PICAs: Patent Vertebral arteries: The intracranial vertebral arteries are patent. Venous sinuses: As permitted by contrast timing, patent. Anatomic variants: None Review of the MIP images confirms the above findings IMPRESSION: Improved patency of the left cervical ICA. The previously occluded short segment of the distal left cervical ICA is patent on the current study. Residual irregularity of the mid and distal left cervical ICA with foci of moderate stenosis. Improved patency of the posterior vertical portion of the left petrous ICA with residual focus of moderate to severe stenosis at the skull base. There is a new focus of soft tissue along the horizontal portion of the left petrous ICA concerning for propagation of dissection versus adherent mural thrombus which results in moderate stenosis. New mild irregularity and stenosis of the mid and distal right cervical ICA concerning for dissection. There is no evidence of intracranial propagation. Intracranial arterial vasculature otherwise patent and normal in caliber. No CT evidence of acute intracranial abnormality. Electronically Signed   By: Denny Flack M.D.   On: 01/19/2024 10:07   MR BRAIN WO CONTRAST Result Date: 01/04/2024 CLINICAL DATA:  Stroke follow-up EXAM: MRI HEAD WITHOUT CONTRAST TECHNIQUE: Multiplanar, multiecho pulse sequences of the brain and surrounding structures were obtained without intravenous contrast. COMPARISON:  01/02/2024 brain MRI FINDINGS: Brain: There is a new, small focus of abnormal diffusion restriction within the left frontal lobe, slightly superior to the previously demonstrated location within the left frontal operculum. No acute or chronic hemorrhage. Normal white matter signal, parenchymal volume and CSF spaces. The midline structures are normal. Vascular: Abnormal left ICA flow void, unchanged. Unchanged area of hyperintense T1-weighted  signal within the visualized part of the cervical left ICA. Skull and upper cervical spine: Normal calvarium and skull base. Visualized upper cervical spine and soft tissues are normal. Sinuses/Orbits:No paranasal sinus fluid levels or advanced mucosal thickening. No mastoid or middle ear effusion. Normal orbits. IMPRESSION: 1. New, small focus of acute ischemia within the left frontal lobe, slightly superior to the previously demonstrated location within the left frontal operculum. No hemorrhage or mass effect. 2. Abnormal left ICA flow void, unchanged. This remains concerning for dissection Electronically Signed   By: Juanetta Nordmann M.D.   On: 01/04/2024 00:00   ECHOCARDIOGRAM COMPLETE Result Date: 01/03/2024    ECHOCARDIOGRAM REPORT   Patient Name:   Patty [name] Date of Exam: 01/03/2024 Medical Rec #:  [id number]         Height:       62.0 in Accession #:    [id number]        Weight:       138.9 lb Date of Birth:  [birth year]         BSA:          1.637 m Patient Age:    47 years          BP:           133/82 mmHg Patient Gender: F                 HR:  66 bpm. Exam Location:  Inpatient Procedure: 2D Echo, Cardiac Doppler and Color Doppler (Both Spectral and Color            Flow Doppler were utilized during procedure). Indications:    Stroke  History:        Patient has no prior history of Echocardiogram examinations.  Sonographer:    Juanita Shaw Referring Phys: [id number] [facility] KHALIQDINA IMPRESSIONS  1. Left ventricular ejection fraction, by estimation, is 50 to 55%. Left ventricular ejection fraction by 2D MOD biplane is 54.0 %. The left ventricle has low normal function. The left ventricle has no regional wall motion abnormalities. Left ventricular diastolic parameters were normal.  2. Right ventricular systolic function is normal. The right ventricular size is normal. Tricuspid regurgitation signal is inadequate for assessing PA pressure.  3. The mitral valve is normal in structure. No evidence  of mitral valve regurgitation. No evidence of mitral stenosis.  4. The aortic valve is tricuspid. There is mild calcification of the aortic valve. Aortic valve regurgitation is not visualized. No aortic stenosis is present.  5. The inferior vena cava is normal in size with greater than 50% respiratory variability, suggesting right atrial pressure of 3 mmHg. FINDINGS  Left Ventricle: Left ventricular ejection fraction, by estimation, is 50 to 55%. Left ventricular ejection fraction by 2D MOD biplane is 54.0 %. The left ventricle has low normal function. The left ventricle has no regional wall motion abnormalities. The left ventricular internal cavity size was normal in size. There is no left ventricular hypertrophy. Left ventricular diastolic parameters were normal. Right Ventricle: The right ventricular size is normal. No increase in right ventricular wall thickness. Right ventricular systolic function is normal. Tricuspid regurgitation signal is inadequate for assessing PA pressure. Left Atrium: Left atrial size was normal in size. Right Atrium: Right atrial size was normal in size. Pericardium: There is no evidence of pericardial effusion. Mitral Valve: The mitral valve is normal in structure. No evidence of mitral valve regurgitation. No evidence of mitral valve stenosis. MV peak gradient, 1.3 mmHg. The mean mitral valve gradient is 1.0 mmHg. Tricuspid Valve: The tricuspid valve is normal in structure. Tricuspid valve regurgitation is not demonstrated. Aortic Valve: The aortic valve is tricuspid. There is mild calcification of the aortic valve. Aortic valve regurgitation is not visualized. No aortic stenosis is present. Aortic valve mean gradient measures 3.0 mmHg. Aortic valve peak gradient measures 5.3 mmHg. Aortic valve area, by VTI measures 2.48 cm. Pulmonic Valve: The pulmonic valve was normal in structure. Pulmonic valve regurgitation is not visualized. Aorta: The aortic root is normal in size and  structure. Venous: The inferior vena cava is normal in size with greater than 50% respiratory variability, suggesting right atrial pressure of 3 mmHg. IAS/Shunts: No atrial level shunt detected by color flow Doppler.  LEFT VENTRICLE PLAX 2D                        Biplane EF (MOD) LVIDd:         3.70 cm         LV Biplane EF:   Left LVIDs:         2.40 cm                          ventricular LV PW:         0.70 cm  ejection LV IVS:        0.80 cm                          fraction by LVOT diam:     1.80 cm                          2D MOD LV SV:         56                               biplane is LV SV Index:   34                               54.0 %. LVOT Area:     2.54 cm                                Diastology                                LV e' medial:    10.90 cm/s LV Volumes (MOD)               LV E/e' medial:  6.2 LV vol d, MOD    108.0 ml      LV e' lateral:   9.57 cm/s A2C:                           LV E/e' lateral: 7.0 LV vol d, MOD    104.0 ml A4C: LV vol s, MOD    46.8 ml A2C: LV vol s, MOD    49.4 ml A4C: LV SV MOD A2C:   61.2 ml LV SV MOD A4C:   104.0 ml LV SV MOD BP:    57.1 ml RIGHT VENTRICLE             IVC RV Basal diam:  3.20 cm     IVC diam: 1.00 cm RV Mid diam:    2.70 cm RV S prime:     10.40 cm/s TAPSE (M-mode): 2.3 cm LEFT ATRIUM           Index        RIGHT ATRIUM           Index LA diam:      2.00 cm 1.22 cm/m   RA Area:     14.60 cm LA Vol (A2C): 20.7 ml 12.64 ml/m  RA Volume:   35.10 ml  21.44 ml/m LA Vol (A4C): 26.8 ml 16.37 ml/m  AORTIC VALVE                    PULMONIC VALVE AV Area (Vmax):    2.17 cm     PV Vmax:       0.64 m/s AV Area (Vmean):   2.07 cm     PV Peak grad:  1.6 mmHg AV Area (VTI):     2.48 cm AV Vmax:           115.00 cm/s AV Vmean:          75.400 cm/s AV VTI:  0.225 m AV Peak Grad:      5.3 mmHg AV Mean Grad:      3.0 mmHg LVOT Vmax:         98.20 cm/s LVOT Vmean:        61.300 cm/s LVOT VTI:          0.219 m LVOT/AV VTI  ratio: 0.97  AORTA Ao Root diam: 3.10 cm Ao Asc diam:  2.80 cm MITRAL VALVE MV Area (PHT): 5.16 cm    SHUNTS MV Area VTI:   3.27 cm    Systemic VTI:  0.22 m MV Peak grad:  1.3 mmHg    Systemic Diam: 1.80 cm MV Mean grad:  1.0 mmHg MV Vmax:       0.58 m/s MV Vmean:      35.2 cm/s MV Decel Time: 147 msec MV E velocity: 67.10 cm/s MV A velocity: 51.90 cm/s MV E/A ratio:  1.29 Dalton McleanMD Electronically signed by Archer Bear Signature Date/Time: 01/03/2024/1:43:11 PM    Final    CT HEAD POST STROKE FOLLOWUP/TIMED/STAT READ Result Date: 01/02/2024 CLINICAL DATA:  Follow-up examination for stroke. EXAM: CT HEAD WITHOUT CONTRAST TECHNIQUE: Contiguous axial images were obtained from the base of the skull through the vertex without intravenous contrast. RADIATION DOSE REDUCTION: This exam was performed according to the departmental dose-optimization program which includes automated exposure control, adjustment of the mA and/or kV according to patient size and/or use of iterative reconstruction technique. COMPARISON:  Prior CT from earlier the same day. FINDINGS: Brain: Cerebral volume within normal limits. Previously identified small left MCA distribution cortical infarct not well visualized by CT. No other acute large vessel territory infarct. No acute intracranial hemorrhage. No mass lesion or midline shift. No hydrocephalus or extra-axial fluid collection. Vascular: No abnormal hyperdense vessel. Skull: Scalp soft tissues within normal limits.  Calvarium intact. Sinuses/Orbits: Globes orbital soft tissues within normal limits. Paranasal sinuses remain clear. No mastoid effusion. Other: None. IMPRESSION: 1. Previously identified small left frontal cortical infarct not well visualized by CT. 2. No other new acute intracranial abnormality. No intracranial hemorrhage. Electronically Signed   By: Virgia Griffins M.D.   On: 01/02/2024 22:01   MR BRAIN WO CONTRAST Result Date: 01/02/2024 CLINICAL DATA:  Neuro  deficit, concern for stroke, acute onset aphasia. EXAM: MRI HEAD WITHOUT CONTRAST TECHNIQUE: Multiplanar, multiecho pulse sequences of the brain and surrounding structures were obtained without intravenous contrast. COMPARISON:  Same day CT head and CTA head and neck. FINDINGS: Brain: Focal restricted diffusion involving the cortex of the left frontal operculum within the left inferior frontal gyrus compatible with acute infarct. No evidence of intracranial hemorrhage. White matter is unremarkable. No edema or midline shift. Posterior fossa is unremarkable. Normal appearance of midline structures. The basilar cisterns are patent. No extra-axial fluid collections. Ventricles: Normal size and configuration of the ventricles. Vascular: Abnormal appearance of the left ICA flow void corresponding to findings of dissection on same day CTA. Skull and upper cervical spine: No focal abnormality. Sinuses/Orbits: Orbits are symmetric. Paranasal sinuses are clear. Other: Mastoid air cells are clear. 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 Abnormal left ICA flow void compatible with findings of dissection on CTA. Electronically Signed   By: Denny Flack M.D.   On: 01/02/2024 19:14   CT ANGIO HEAD NECK W WO CM (CODE STROKE) Result Date: 01/02/2024 CLINICAL DATA:  Neuro deficit, concern for stroke, sudden onset aphasia. EXAM: CT ANGIOGRAPHY HEAD AND NECK CT PERFUSION BRAIN  TECHNIQUE: Multidetector CT imaging of the head and neck was performed using the standard protocol during bolus administration of intravenous contrast. Multiplanar CT image reconstructions and MIPs were obtained to evaluate the vascular anatomy. Carotid stenosis measurements (when applicable) are obtained utilizing NASCET criteria, using the distal internal carotid diameter as the denominator. Multiphase CT imaging of the brain was performed following IV bolus contrast injection. Subsequent parametric perfusion  maps were calculated using RAPID software. RADIATION DOSE REDUCTION: This exam was performed according to the departmental dose-optimization program which includes automated exposure control, adjustment of the mA and/or kV according to patient size and/or use of iterative reconstruction technique. CONTRAST:  75mL OMNIPAQUE  IOHEXOL  350 MG/ML SOLN COMPARISON:  Same-day head CT. FINDINGS: CTA NECK FINDINGS Aortic arch: Standard configuration of the aortic arch. Imaged portion shows no evidence of aneurysm or dissection. No significant stenosis of the major arch vessel origins. Pulmonary arteries: As permitted by contrast timing, there are no filling defects in the visualized pulmonary arteries. Subclavian arteries: The subclavian arteries are patent bilaterally. Right carotid system: No evidence of dissection, stenosis (50% or greater), or occlusion. Left carotid system: The common carotid artery is patent to the ICA bifurcation. Proximal cervical ICA is relatively normal in caliber with abrupt caliber change of the vessel at approximately the level of C2. There is severe narrowing and significant irregularity of the mid and distal cervical ICA. Possible 1-2 mm short segment occlusion of the distal cervical ICA with reconstitution of the vessel. Vertebral arteries: Codominant. No evidence of dissection, stenosis (50% or greater), or occlusion. Skeleton: No acute or aggressive finding. Disc space narrowing noted at C5-6. Other neck: The visualized airway is patent. No cervical lymphadenopathy. Upper chest: Visualized lung apices are clear. Review of the MIP images confirms the above findings CTA HEAD FINDINGS ANTERIOR CIRCULATION: There is severe narrowing and possible occlusion at the posterior vertical portion of the left petrous ICA. There is reconstitution of the horizontal left petrous ICA which appears diminished in caliber and irregular with diminished intraluminal contrast. There is additional diminished  intraluminal contrast within the cavernous and supraclinoid ICAs without significant irregularity of the vessel. The right ICA is patent from the skull base to the ICA terminus without significant atherosclerosis or stenosis. MCAs: The middle cerebral arteries are patent bilaterally. ACAs: The anterior cerebral arteries are patent bilaterally. POSTERIOR CIRCULATION: No significant stenosis, proximal occlusion, aneurysm, or vascular malformation. PCAs: The posterior cerebral arteries are patent bilaterally. Pcomm: Not well visualized. SCAs: The superior cerebellar arteries are patent bilaterally. Basilar artery: Patent AICAs: Not well visualized. PICAs: Patent Vertebral arteries: Patent bilaterally. Tortuosity of the left V4 segment. Venous sinuses: As permitted by contrast timing, patent. Anatomic variants: None Review of the MIP images confirms the above findings CT Brain Perfusion Findings: ASPECTS: 10 CBF (<30%) Volume: 0mL Perfusion (Tmax>6.0s) volume: 0mL Mismatch Volume: 0mL Infarction Location:None identified IMPRESSION: Irregularity and severe narrowing of the mid and distal left cervical ICA concerning for dissection. There is a short approximately 1-2 mm segment of occlusion of the distal left cervical ICA with reconstitution proximal to the skull base. Additional significant irregularity and likely focal occlusion at the posterior vertical portion of the left petrous ICA. Reconstitution of the horizontal left petrous ICA which is diminutive in caliber and irregularity concerning for intracranial propagation of dissection. Diminished intraluminal contrast within the left cavernous and left supraclinoid ICA without significant irregularity. Arterial vasculature in the head and neck is otherwise patent. No core infarct identified on CT perfusion. These results  were called by telephone at the time of interpretation on 01/02/2024 at 6:40 pm to provider [facility] , who verbally acknowledged these  results. Electronically Signed   By: Denny Flack M.D.   On: 01/02/2024 19:03   CT CEREBRAL PERFUSION W CONTRAST Result Date: 01/02/2024 CLINICAL DATA:  Neuro deficit, concern for stroke, sudden onset aphasia. EXAM: CT ANGIOGRAPHY HEAD AND NECK CT PERFUSION BRAIN TECHNIQUE: Multidetector CT imaging of the head and neck was performed using the standard protocol during bolus administration of intravenous contrast. Multiplanar CT image reconstructions and MIPs were obtained to evaluate the vascular anatomy. Carotid stenosis measurements (when applicable) are obtained utilizing NASCET criteria, using the distal internal carotid diameter as the denominator. Multiphase CT imaging of the brain was performed following IV bolus contrast injection. Subsequent parametric perfusion maps were calculated using RAPID software. RADIATION DOSE REDUCTION: This exam was performed according to the departmental dose-optimization program which includes automated exposure control, adjustment of the mA and/or kV according to patient size and/or use of iterative reconstruction technique. CONTRAST:  75mL OMNIPAQUE  IOHEXOL  350 MG/ML SOLN COMPARISON:  Same-day head CT. FINDINGS: CTA NECK FINDINGS Aortic arch: Standard configuration of the aortic arch. Imaged portion shows no evidence of aneurysm or dissection. No significant stenosis of the major arch vessel origins. Pulmonary arteries: As permitted by contrast timing, there are no filling defects in the visualized pulmonary arteries. Subclavian arteries: The subclavian arteries are patent bilaterally. Right carotid system: No evidence of dissection, stenosis (50% or greater), or occlusion. Left carotid system: The common carotid artery is patent to the ICA bifurcation. Proximal cervical ICA is relatively normal in caliber with abrupt caliber change of the vessel at approximately the level of C2. There is severe narrowing and significant irregularity of the mid and distal cervical ICA.  Possible 1-2 mm short segment occlusion of the distal cervical ICA with reconstitution of the vessel. Vertebral arteries: Codominant. No evidence of dissection, stenosis (50% or greater), or occlusion. Skeleton: No acute or aggressive finding. Disc space narrowing noted at C5-6. Other neck: The visualized airway is patent. No cervical lymphadenopathy. Upper chest: Visualized lung apices are clear. Review of the MIP images confirms the above findings CTA HEAD FINDINGS ANTERIOR CIRCULATION: There is severe narrowing and possible occlusion at the posterior vertical portion of the left petrous ICA. There is reconstitution of the horizontal left petrous ICA which appears diminished in caliber and irregular with diminished intraluminal contrast. There is additional diminished intraluminal contrast within the cavernous and supraclinoid ICAs without significant irregularity of the vessel. The right ICA is patent from the skull base to the ICA terminus without significant atherosclerosis or stenosis. MCAs: The middle cerebral arteries are patent bilaterally. ACAs: The anterior cerebral arteries are patent bilaterally. POSTERIOR CIRCULATION: No significant stenosis, proximal occlusion, aneurysm, or vascular malformation. PCAs: The posterior cerebral arteries are patent bilaterally. Pcomm: Not well visualized. SCAs: The superior cerebellar arteries are patent bilaterally. Basilar artery: Patent AICAs: Not well visualized. PICAs: Patent Vertebral arteries: Patent bilaterally. Tortuosity of the left V4 segment. Venous sinuses: As permitted by contrast timing, patent. Anatomic variants: None Review of the MIP images confirms the above findings CT Brain Perfusion Findings: ASPECTS: 10 CBF (<30%) Volume: 0mL Perfusion (Tmax>6.0s) volume: 0mL Mismatch Volume: 0mL Infarction Location:None identified IMPRESSION: Irregularity and severe narrowing of the mid and distal left cervical ICA concerning for dissection. There is a short  approximately 1-2 mm segment of occlusion of the distal left cervical ICA with reconstitution proximal to the skull base. Additional  significant irregularity and likely focal occlusion at the posterior vertical portion of the left petrous ICA. Reconstitution of the horizontal left petrous ICA which is diminutive in caliber and irregularity concerning for intracranial propagation of dissection. Diminished intraluminal contrast within the left cavernous and left supraclinoid ICA without significant irregularity. Arterial vasculature in the head and neck is otherwise patent. No core infarct identified on CT perfusion. These results were called by telephone at the time of interpretation on 01/02/2024 at 6:40 pm to provider [facility] , who verbally acknowledged these results. Electronically Signed   By: Denny Flack M.D.   On: 01/02/2024 19:03   CT HEAD CODE STROKE WO CONTRAST Result Date: 01/02/2024 CLINICAL DATA:  Code stroke.  Neuro deficit, concern for stroke. EXAM: CT HEAD WITHOUT CONTRAST TECHNIQUE: Contiguous axial images were obtained from the base of the skull through the vertex without intravenous contrast. RADIATION DOSE REDUCTION: This exam was performed according to the departmental dose-optimization program which includes automated exposure control, adjustment of the mA and/or kV according to patient size and/or use of iterative reconstruction technique. COMPARISON:  None Available. FINDINGS: Brain: No acute intracranial hemorrhage. No CT evidence of acute infarct. No edema, mass effect, or midline shift. The basilar cisterns are patent. Ventricles: The ventricles are normal. Vascular: No hyperdense vessel or unexpected calcification. Skull: No acute or aggressive finding. Orbits: Orbits are symmetric. Sinuses: The visualized paranasal sinuses are clear. Other: Mastoid air cells are clear. ASPECTS [facility] Stroke Program Early CT Score) - Ganglionic level infarction (caudate, lentiform nuclei,  internal capsule, insula, M1-M3 cortex): 7 - Supraganglionic infarction (M4-M6 cortex): 3 Total score (0-10 with 10 being normal): 10 IMPRESSION: 1. No CT evidence of acute intracranial abnormality. 2. ASPECTS is 10 These results were communicated to Dr. Murvin Arthurs at 5:26 pm on 01/02/2024 by text page via the [facility] messaging system. Electronically Signed   By: Denny Flack M.D.   On: 01/02/2024 17:26     PHYSICAL EXAM  Temp:  [98 F (36.7 C)-98.2 F (36.8 C)] 98 F (36.7 C) (05/29 1333) Pulse Rate:  [50-79] 66 (05/29 1257) Resp:  [13-20] 14 (05/29 1257) BP: (112-174)/(66-99) 121/83 (05/29 1257) SpO2:  [97 %-100 %] 100 % (05/29 1257) Weight:  [78 kg] 63 kg (05/28 2009)  General - Well nourished, well developed, in no apparent distress.  Ophthalmologic - fundi not visualized due to noncooperation.  Cardiovascular - Regular rhythm and rate.  Mental Status -  Level of arousal and orientation to time, place, and person were intact. Language including expression, naming, repetition, comprehension was assessed and found intact. Fund of Knowledge was assessed and was intact.  Cranial Nerves II - XII - II - Visual field intact OU. III, IV, VI - Extraocular movements intact. V - Facial sensation intact bilaterally. VII - Facial movement intact bilaterally. VIII - Hearing & vestibular intact bilaterally. X - Palate elevates symmetrically. XI - Chin turning & shoulder shrug intact bilaterally. XII - Tongue protrusion intact.  Motor Strength - The patient's strength was normal in all extremities and pronator drift was absent.  Bulk was normal and fasciculations were absent.   Motor Tone - Muscle tone was assessed at the neck and appendages and was normal.  Reflexes - The patient's reflexes were symmetrical in all extremities and she had no pathological reflexes.  Sensory - Light touch, temperature/pinprick were assessed and were symmetrical.    Coordination - The patient had normal  movements in the hands and feet with no ataxia or dysmetria.  Tremor was absent.  Gait and Station - deferred.   ASSESSMENT/PLAN Patty [name] is a 48 y.o. female with history of lower back pain and neck pain admitted for aphasia. TNK given.  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 There is a new focus of soft tissue along the horizontal portion of the left petrous ICA concerning for propagation of dissection versus adherent mural thrombus which results in moderate stenosis. New right ICA proximal to petrous portion irregularity concerning for new dissection MRI no acute infarct Plan for repeat CTA in 2-3 months Plan for cerebral angio in 1-2 months (not now as There is a concern of horizontal portion of the left petrous ICA with adherent mural thrombus during the healing process) - Dr. Festus Hubert to arrange Etiology unclear - b/l dissection need to consider FMD or EDS type 4 although remote neck maneuver with chiropractor is also possible Dr. Festus Hubert has ordered genetic testing Continue DAPT and statin Recommend gentle neck movement and avoid abrupt neck movement/extension in the future  Recent hx of stroke 01/02/24 admitted for speech difficulty. S/p TNK. MRI x 2 left frontal cortical small infarct on 5/11 and 5/13. CT head and neck showed irregularity and severe narrowing of the mid and distal left cervical ICA concerning for dissection. There is a short approximately 1-2 mm segment of occlusion of the distal left cervical ICA with reconstitution proximal to the skull base. Additional significant irregularity and likely focal occlusion at the posterior vertical portion of the left petrous ICA. Reconstitution of the horizontal left petrous ICA which is diminutive in caliber and irregularity concerning for intracranial propagation of  dissection. Diminished intraluminal contrast within the left cavernous and left supraclinoid ICA without significant irregularity. 2D Echo EF 50 to 55%, LDL 96, HgbA1c 4.9, UDS negative. Discharged on DAPT and statin.  Followed with Dr. Festus Hubert at [facility], ordered genetic testing and checked ANA and ANCA which were negative. Plan for outpt angiogram with Dr. Alvira Josephs.  Hypertension BP high on presentation Stable now Likely due to anxiety but put on metoprolol 12.5 bid by hospitalist service Long term BP goal normotensive  Hyperlipidemia Home meds: None LDL 96, goal < 70 Now on Lipitor 40 Continue statin at discharge  Other Stroke Risk Factors Hx of neck manipulation with chiropractor   Other Active Problems Lower back pain and neck pain OCP use - follow-up with OB/GYN regarding progesterone only birth control pills if needed  Hospital day # 1  Neurology will sign off. Please call with questions. Pt will follow up with Dr. Festus Hubert at [facility] in about 4 weeks. Thanks for the consult.   Consuelo Denmark, MD PhD Stroke Neurology 01/20/2024 2:11 PM  I discussed with Dr. Felipe Horton and Dr. Festus Hubert. I spent extensive total face-to-face time with the patient and her husband, reviewing CTA results, etiology of b/l ICA dissection, and discussing the future testing, treatment plan and educated on careful neck maneuver. This patient's care requiresreview of multiple databases, neurological assessment, discussion with family, other specialists and medical decision making of high complexity.      To contact Stroke Continuity provider, please refer to [email or web address]. After hours, contact General Neurology

## 2024-01-20 NOTE — ED Notes (Signed)
 Pt is up and independent, ambulating to bathroom without assistance.   Pt sitting upright in bed, resting comfortably, reading a book.  Pt aox4, gcs 15, with no acute signs of distress.

## 2024-01-20 NOTE — Progress Notes (Signed)
  Carryover admission to the Day Admitter.  I discussed this case with the EDP, Dr. Morris Arch.  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  No report of any acute focal neurologic deficits.  Headache now improved.  MRI brain showed no evidence of new stroke, all imaging was also notable for new right-sided internal carotid artery dissection.  EDP (Dr. Kermit Ped) has discussed with on-call neurology, Dr. Lindzen, who will formally consult, and will assist with consulting Dr. Alvira Josephs of interventional neuroradiology, while also recommending initiation of dual antiplatelet therapy. Neurology has  placed corresponding orders for antiplatelet therapy.  I have placed an order for inpatient admission to PCU for further evaluation management of the above.  I have placed some additional preliminary admit orders via the adult multi-morbid admission order set. I have also ordered n.p.o. for potential interventional neuroradiology procedure.  We have also ordered as needed IV hydralazine  for hypertension, every 4 hours neurochecks, as needed IV fentanyl  for pain/headache, and ordered morning labs in the form of CMP, CBC, magnesium  level.    Camelia Cavalier, DO Hospitalist

## 2024-01-20 NOTE — Telephone Encounter (Signed)
 Pt husband called and LM with AN. Patty [name] is having issues with her blood pressure and she is having a hard time controlling it. He would like to talk with someone immediately. Pt is in ED as of 01/20/24. I gave the full AN to sheena.

## 2024-01-20 NOTE — H&P (Addendum)
 History and Physical    Patient: Patty [name] ZOX:[id number] DOB: 08/26/1975 DOA: 01/19/2024 DOS: the patient was seen and examined on 01/20/2024 PCP: Pcp, No  Patient coming from: Home  Chief Complaint:  Chief Complaint  Patient presents with   Hypertension   Headache   HPI: Patty [name] is a 48 y.o. female with medical history significant of hypertension, hyperlipidemia, and chronic back pain presented with complaints of headache and elevated blood pressures.  Records note she had just recently been hospitalized 5/11-5/13 after presenting with aphasia and found to have left frontal cortical stroke secondary to left ICA dissection.  Patient was given started on dual antiplatelet therapy.  She notes that trauma r was uled out as a cause. Since then she has been recovering, but continues to experience persistent headaches and high blood pressure. Her speech is gradually improving.  She attempted to return to work part-time but found it challenging due to dizziness and high blood pressure. Her blood pressure is lower in the mornings but has not experienced significant drops. The dizziness is described as lightheadedness rather than vertigo, accompanied by a persistent heaviness in her head.  In the past two days, she has developed a sensation of throat tightness and warmth, which has made swallowing difficult, particularly with saliva and food, though drinking is easier. She also reports facial tingling earlier this week.  A follow-up CT scan was performed last Thursday after following up with Dr. Festus Hubert of neurology in outpatient setting.  She was informed of the results yesterday which showed improved patency of previously occluded short segment of distal left cervical ICA and improved patency of posterior vertical portion of left petrous ICA. There was a new focus of soft tissue along horizontal portion of left petrous ICA concerning for propagation of dissection versus mural thrombus,  and new irregularity and stenosis of the mid and distal right internal carotid artery concerning for dissection..  At home her blood pressure consistently in the 130s to 150s since the stroke.  She had not been sent home with any blood pressure medications.  At discharge  No focal weakness. Sensation felt slightly different on the left side of her face yesterday.  In the emergency department patient was noted to be afebrile with blood pressures elevated up to 174/92, and all other vital signs maintained.  Labs were relatively unremarkable except for a calcium  of 8.8.  MRI of the brain have been obtained which showed no evidence of new stroke.  Dr. Renaee Caro of neurology had been consulted and would assist in consulting Dr. Alvira Josephs of interventional neuroradiology.  Patient had been given metoprolol 25 mg p.o., potassium chloride 40 meq p.o., and nitroglycerin to chest.  Review of Systems: As mentioned in the history of present illness. All other systems reviewed and are negative. Past Medical History:  Diagnosis Date   Chronic back pain    officially undiagnosed   Stroke [facility])    History reviewed. No pertinent surgical history. Social History:  reports that she has never smoked. She has never used smokeless tobacco. She reports current alcohol use of about 1.0 standard drink of alcohol per week. She reports that she does not use drugs.  No Known Allergies  History reviewed. No pertinent family history.  Prior to Admission medications   Medication Sig Start Date End Date Taking? Authorizing Provider  acetaminophen  (TYLENOL ) 500 MG tablet Take 1,000 mg by mouth every 6 (six) hours as needed for mild pain (pain score 1-3), moderate pain (pain  score 4-6) or headache.   Yes [provider]  aspirin  EC 81 MG tablet Take 1 tablet (81 mg total) by mouth daily. Swallow whole. 01/05/24  Yes Consuelo Denmark, MD  atorvastatin  (LIPITOR) 40 MG tablet Take 1 tablet (40 mg total) by mouth daily. 01/05/24   Yes Consuelo Denmark, MD  clopidogrel  (PLAVIX ) 75 MG tablet Take 1 tablet (75 mg total) by mouth daily. 01/05/24  Yes Consuelo Denmark, MD  gabapentin  (NEURONTIN ) 300 MG capsule Take 1 capsule (300 mg total) by mouth 3 (three) times daily. 01/14/24  Yes Jaffe, Adam R, DO  magnesium oxide (MAG-OX) 400 (240 Mg) MG tablet Take 400 mg by mouth at bedtime.   Yes [provider]  venlafaxine  XR (EFFEXOR -XR) 37.5 MG 24 hr capsule TAKE 1 CAPSULE BY MOUTH DAILY WITH BREAKFAST. 10/25/23  Yes Isidro Margo, DO    Physical Exam: Vitals:   01/20/24 0245 01/20/24 0515 01/20/24 0600 01/20/24 0645  BP: 113/66 112/66 117/67 113/68  Pulse: 63 67 (!) 58 64  Resp: 17 17 17 15   Temp: 98.2 F (36.8 C)  98 F (36.7 C)   TempSrc: Oral     SpO2: 99% 99% 98% 98%  Weight:       Constitutional: Young female currently in NAD, calm, comfortable Eyes: PERRL, lids and conjunctivae normal ENMT: Mucous membranes are moist.  Normal dentition.  Neck: normal, supple  Respiratory: clear to auscultation bilaterally, no wheezing, no crackles. Normal respiratory effort.  Cardiovascular: Regular rate and rhythm, no murmurs / rubs / gallops. No extremity edema. 2+ pedal pulses.  Abdomen: no tenderness, no masses palpated.   Bowel sounds positive.  Musculoskeletal: no clubbing / cyanosis. No joint deformity upper and lower extremities. Good ROM, no contractures. Normal muscle tone.  Skin: no rashes, lesions, ulcers. No induration Neurologic: CN 2-12 grossly intact.   Strength 5/5 in all 4.  Mild aphasia appreciated. Psychiatric: Normal judgment and insight. Alert and oriented x 3. Normal mood.   Data Reviewed:  Reviewed labs, imaging, and pertinent records as documented.  Assessment and Plan:  Right internal carotid artery dissection Acute.  Patient presented with complaints of persistent headache and new sensory deficit.  Recent CT angiogram obtained on 5/22 significant for new mild irregularity and stenosis of the mid  and distal right cervical ICA concerning for right internal carotid artery dissection.  Case had been discussed with Dr. Lindzen who initially recommended interventional radiology evaluation.  MRI was obtained and did not reveal any signs of acute stroke.  Question possible workup for connective tissue disorder or possible vasculitis. - Admit to a telemetry bed - Neurochecks - Continue dual antiplatelet therapy - Appreciate neurology consultative services we will follow-up for any further recommendations.  Hypertension In the emergency department blood pressures noted to be elevated up to 174/92.  At home patient reports systolic blood pressures in the 130s to 150s.  She is not on pressure medication in outpatient setting.  Been given metoprolol 25 mg p.o. 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 Patient admitted earlier this month with aphasia found to have left frontal cortical infarct thought secondary to left ICA dissection.  Patient had been given thrombolytics and started on dual antiplatelet therapy to be continued for at least 3 months. - Continue outpatient speech therapy  Hyperlipidemia LDL 96.  Atorvastatin  40 mg daily. - Continue atorvastatin   DVT prophylaxis: Lovenox  Advance Care Planning:   Code Status: Full Code   Consults: Neurology  Family Communication: Husband updated at bedside Severity of Illness: The appropriate patient status for this patient is OBSERVATION. Observation status is judged to be reasonable and necessary in order to provide the required intensity of service to ensure the patient's safety. The patient's presenting symptoms, physical exam findings, and initial radiographic and laboratory data in the context of their medical condition is felt to place them at decreased risk for further clinical deterioration.  Furthermore, it is anticipated that the patient will be medically stable for discharge from the hospital within 2 midnights of admission.   Author: Lena Qualia, MD 01/20/2024 8:38 AM  For on call review [email or web address].

## 2024-01-20 NOTE — Discharge Summary (Signed)
 Physician Discharge Summary   Patient: Patty [name] MRN: [id number] DOB: Jul 16, 1976  Admit date:     01/19/2024  Discharge date: 01/20/24  Discharge Physician: Lena Qualia   PCP: Pcp, No   Recommendations at discharge:   Recommended patient follow-up in outpatient setting with Dr. Festus Hubert  Discharge Diagnoses: Principal Problem:   Dissection of right carotid artery [facility] Active Problems:   Essential hypertension   History of dissection of internal carotid artery   History of CVA with residual deficit   Hyperlipidemia   Internal carotid artery dissection (HCC)  Resolved Problems:   * No resolved hospital problems. [facility] Course: Patty [name] is a 48 y.o. female with medical history significant of hypertension, hyperlipidemia, and chronic back pain presented with complaints of headache and elevated blood pressures.   Records note she had just recently been hospitalized 5/11-5/13 after presenting with aphasia and found to have left frontal cortical stroke secondary to left ICA dissection.  Patient was given started on dual antiplatelet therapy.  She notes that trauma r was uled out as a cause. Since then she has been recovering, but continues to experience persistent headaches and high blood pressure. Her speech is gradually improving.   She attempted to return to work part-time but found it challenging due to dizziness and high blood pressure. Her blood pressure is lower in the mornings but has not experienced significant drops. The dizziness is described as lightheadedness rather than vertigo, accompanied by a persistent heaviness in her head.   In the past two days, she has developed a sensation of throat tightness and warmth, which has made swallowing difficult, particularly with saliva and food, though drinking is easier. She also reports facial tingling earlier this week.   A follow-up CT scan was performed last Thursday after following up with Dr. Festus Hubert of  neurology in outpatient setting.  She was informed of the results yesterday which showed improved patency of previously occluded short segment of distal left cervical ICA and improved patency of posterior vertical portion of left petrous ICA. There was a new focus of soft tissue along horizontal portion of left petrous ICA concerning for propagation of dissection versus mural thrombus, and new irregularity and stenosis of the mid and distal right internal carotid artery concerning for dissection..  At home her blood pressure consistently in the 130s to 150s since the stroke.  She had not been sent home with any blood pressure medications.  At discharge   No focal weakness. Sensation felt slightly different on the left side of her face yesterday.   In the emergency department patient was noted to be afebrile with blood pressures elevated up to 174/92, and all other vital signs maintained.  Labs were relatively unremarkable except for a calcium  of 8.8.  MRI of the brain have been obtained which showed no evidence of new stroke.  Dr. Renaee Caro of neurology had been consulted and would assist in consulting Dr. Alvira Josephs of interventional neuroradiology.  Patient had been given metoprolol 25 mg p.o., potassium chloride 40 meq p.o., and nitroglycerin paste.    Assessment and Plan:   Right internal carotid artery dissection Acute.  Patient presented with complaints of persistent headache and new sensory deficit.  Recent CT angiogram obtained on 5/22 significant for new mild irregularity and stenosis of the mid and distal right cervical ICA concerning for right internal carotid artery dissection.  Case had been discussed with Dr. Lindzen who initially recommended interventional radiology evaluation.  MRI was  obtained and did not reveal any signs of acute stroke. She was started on aspirin  and Plavix .  Dr. Christiane Cowing of neurology followed up with Dr. Festus Hubert who noted that he has ordered a genetic panel for vascular causes  which is still pending, and checked autoimmune labs as well which was negative.  Dr. Festus Hubert stated that he will will refer to [facility] for further workup and did not need to be seen in the inpatient setting.[name]  Hypertension In the emergency department blood pressures noted to be elevated up to 174/92.  At home patient reports systolic blood pressures in the 130s to 150s.  She is not on pressure medication in outpatient setting.  Been given metoprolol 25 mg p.o. x 1 dose.  Plan to start metoprolol 12.5 mg twice daily.  Prescription sent to patient's pharmacy.  History of CVA with residual deficit secondary to left internal carotid artery dissection. Patient admitted earlier this month with aphasia found to have left frontal cortical infarct thought secondary to left ICA dissection.  Patient had been given thrombolytics and started on dual antiplatelet therapy to be continued for at least 3 months. - Continue outpatient speech therapy   Hyperlipidemia LDL 96.  Atorvastatin  40 mg daily. - Continue atorvastatin         Consultants: Neurology Procedures performed: None Disposition: Home Diet recommendation:  Discharge Diet Orders (From admission, onward)     Start     Ordered   01/20/24 0000  Diet - low sodium heart healthy        01/20/24 1339           Cardiac diet DISCHARGE MEDICATION: Allergies as of 01/20/2024   No Known Allergies      Medication List     TAKE these medications    acetaminophen  500 MG tablet Commonly known as: TYLENOL  Take 1,000 mg by mouth every 6 (six) hours as needed for mild pain (pain score 1-3), moderate pain (pain score 4-6) or headache.   aspirin  EC 81 MG tablet Take 1 tablet (81 mg total) by mouth daily. [name] whole.   atorvastatin  40 MG tablet Commonly known as: LIPITOR Take 1 tablet (40 mg total) by mouth daily.   clopidogrel  75 MG tablet Commonly known as: PLAVIX  Take 1 tablet (75 mg total) by mouth daily.   gabapentin  300 MG  capsule Commonly known as: NEURONTIN  Take 1 capsule (300 mg total) by mouth 3 (three) times daily.   magnesium oxide 400 (240 Mg) MG tablet Commonly known as: MAG-OX Take 400 mg by mouth at bedtime.   metoprolol tartrate 25 MG tablet Commonly known as: LOPRESSOR Take 0.5 tablets (12.5 mg total) by mouth 2 (two) times daily.   venlafaxine  XR 37.5 MG 24 hr capsule Commonly known as: EFFEXOR -XR TAKE 1 CAPSULE BY MOUTH DAILY WITH BREAKFAST.        Discharge Exam: Filed Weights   01/19/24 2009  Weight: 63 kg   Constitutional: NAD, calm, comfortable Eyes: PERRL, lids and conjunctivae normal ENMT: Mucous membranes are moist.   Neck: normal, supple,  Respiratory: clear to auscultation bilaterally, no wheezing, no crackles. Normal respiratory effort. No accessory muscle use.  Cardiovascular: Regular rate and rhythm, no murmurs / rubs / gallops. No extremity edema. 2+ pedal pulses. No carotid bruits.  Abdomen: no tenderness, no masses palpated.  Bowel sounds positive.  Musculoskeletal: no clubbing / cyanosis. No joint deformity upper and lower extremities. Good ROM, no contractures. Normal muscle tone.  Skin: no rashes, lesions, ulcers. No induration Neurologic:  CN 2-12 grossly intact. Sensation noted to be normal on the left side of the face.  Mild aphasia. Strength 5/5 in all 4.  Psychiatric: Normal judgment and insight. Alert and oriented x 3. Normal mood.    Condition at discharge: good  The results of significant diagnostics from this hospitalization (including imaging, microbiology, ancillary and laboratory) are listed below for reference.   Imaging Studies: MR BRAIN WO CONTRAST Result Date: 01/19/2024 CLINICAL DATA:  Acute neurologic deficit EXAM: MRI HEAD WITHOUT CONTRAST TECHNIQUE: Multiplanar, multiecho pulse sequences of the brain and surrounding structures were obtained without intravenous contrast. COMPARISON:  01/03/2024 FINDINGS: Brain: No acute infarct, mass effect  or extra-axial collection. No acute or chronic hemorrhage. Normal white matter signal, parenchymal volume and CSF spaces. The midline structures are normal. Vascular: Normal flow voids. Skull and upper cervical spine: Normal calvarium and skull base. Visualized upper cervical spine and soft tissues are normal. Sinuses/Orbits:No paranasal sinus fluid levels or advanced mucosal thickening. No mastoid or middle ear effusion. Normal orbits. IMPRESSION: Normal brain MRI. Electronically Signed   By: Juanetta Nordmann M.D.   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 EXAM: CT ANGIOGRAPHY HEAD AND NECK WITH AND WITHOUT CONTRAST TECHNIQUE: Multidetector CT imaging of the head and neck was performed using the standard protocol during bolus administration of intravenous contrast. Multiplanar CT image reconstructions and MIPs were obtained to evaluate the vascular anatomy. Carotid stenosis measurements (when applicable) are obtained utilizing NASCET criteria, using the distal internal carotid diameter as the denominator. RADIATION DOSE REDUCTION: This exam was performed according to the departmental dose-optimization program which includes automated exposure control, adjustment of the mA and/or kV according to patient size and/or use of iterative reconstruction technique. CONTRAST:  75mL ISOVUE-370 IOPAMIDOL (ISOVUE-370) INJECTION 76% COMPARISON:  CT head and CTA head and neck 01/02/2024. MRI head 01/03/2024 and earlier. FINDINGS: CT HEAD FINDINGS Brain: No acute intracranial hemorrhage. Previously noted small foci of infarct in the left frontal lobe are not well visualized on the current study. No edema, mass effect, or midline shift. The basilar cisterns are patent. Posterior fossa is unremarkable. No extra-axial fluid collections. Ventricles: Ventricles are normal in size and configuration. Vascular: No hyperdense  vessel. Skull: No acute or aggressive finding. Sinuses/orbits: The visualized paranasal sinuses are clear. Orbits are symmetric. Other: Mastoid air cells are clear. CTA NECK FINDINGS Aortic arch: Standard configuration of the aortic arch. Imaged portion shows no evidence of aneurysm or dissection. No significant stenosis of the major arch vessel origins. Pulmonary arteries: As permitted by contrast timing, there are no filling defects in the visualized pulmonary arteries. Subclavian arteries: The subclavian arteries are patent bilaterally. Right carotid system: Patent from the origin to the skull base. The carotid bifurcation is widely patent without significant atherosclerosis. External carotid artery branches are patent. There is mild irregularity of the vessel with associated mild luminal narrowing which is new from prior for example noted on axial image 144. Left carotid system: Patent from the origin to the skull base. The carotid bifurcation is widely patent without significant atherosclerosis. External carotid artery branches are patent. There is improved patency of the internal carotid artery. Mild irregularity of the mid and distal cervical ICA is improved compared to the prior study. There are residual foci of moderate narrowing for example axial images 147, 165. Vertebral arteries: Codominant. No evidence of dissection, stenosis (50% or greater),  or occlusion. Skeleton: No acute findings. Degenerative changes in the cervical spine most pronounced at C5-6. Other neck: The visualized airway is patent. No cervical lymphadenopathy. Upper chest: Visualized lung apices are clear. Review of the MIP images confirms the above findings CTA HEAD FINDINGS ANTERIOR CIRCULATION: The intracranial internal carotid arteries are patent. There is irregularity of the posterior vertical portion of the left petrous ICA is improved from the prior study. There is no further occlusion identified within the left petrous ICA. Focal  moderate to severe stenosis of the proximal left petrous ICA. There is additional irregularity along the horizontal portion of the left petrous ICA with soft tissue along the vessel wall which may reflect sequelae of dissection versus adherent mural thrombus. There is associated moderate luminal narrowing. The left ICA is otherwise patent and similar in caliber to the prior study. The right ICA demonstrates irregularity and focal moderate narrowing just proximal to the skull base which is new from prior. The intracranial ICA is otherwise patent and normal in caliber. MCAs: The middle cerebral arteries are patent bilaterally. ACAs: The anterior cerebral arteries are patent bilaterally. POSTERIOR CIRCULATION: No significant stenosis, proximal occlusion, aneurysm, or vascular malformation. PCAs: The posterior cerebral arteries are patent bilaterally. Pcomm: Not well visualized. SCAs: The superior cerebellar arteries are patent bilaterally. Basilar artery: Patent AICAs: Patent PICAs: Patent Vertebral arteries: The intracranial vertebral arteries are patent. Venous sinuses: As permitted by contrast timing, patent. Anatomic variants: None Review of the MIP images confirms the above findings IMPRESSION: Improved patency of the left cervical ICA. The previously occluded short segment of the distal left cervical ICA is patent on the current study. Residual irregularity of the mid and distal left cervical ICA with foci of moderate stenosis. Improved patency of the posterior vertical portion of the left petrous ICA with residual focus of moderate to severe stenosis at the skull base. There is a new focus of soft tissue along the horizontal portion of the left petrous ICA concerning for propagation of dissection versus adherent mural thrombus which results in moderate stenosis. New mild irregularity and stenosis of the mid and distal right cervical ICA concerning for dissection. There is no evidence of intracranial propagation.  Intracranial arterial vasculature otherwise patent and normal in caliber. No CT evidence of acute intracranial abnormality. Electronically Signed   By: Denny Flack M.D.   On: 01/19/2024 10:07   MR BRAIN WO CONTRAST Result Date: 01/04/2024 CLINICAL DATA:  Stroke follow-up EXAM: MRI HEAD WITHOUT CONTRAST TECHNIQUE: Multiplanar, multiecho pulse sequences of the brain and surrounding structures were obtained without intravenous contrast. COMPARISON:  01/02/2024 brain MRI FINDINGS: Brain: There is a new, small focus of abnormal diffusion restriction within the left frontal lobe, slightly superior to the previously demonstrated location within the left frontal operculum. No acute or chronic hemorrhage. Normal white matter signal, parenchymal volume and CSF spaces. The midline structures are normal. Vascular: Abnormal left ICA flow void, unchanged. Unchanged area of hyperintense T1-weighted signal within the visualized part of the cervical left ICA. Skull and upper cervical spine: Normal calvarium and skull base. Visualized upper cervical spine and soft tissues are normal. Sinuses/Orbits:No paranasal sinus fluid levels or advanced mucosal thickening. No mastoid or middle ear effusion. Normal orbits. IMPRESSION: 1. New, small focus of acute ischemia within the left frontal lobe, slightly superior to the previously demonstrated location within the left frontal operculum. No hemorrhage or mass effect. 2. Abnormal left ICA flow void, unchanged. This remains concerning for dissection Electronically Signed   By:  Juanetta Nordmann M.D.   On: 01/04/2024 00:00   ECHOCARDIOGRAM COMPLETE Result Date: 01/03/2024    ECHOCARDIOGRAM REPORT   Patient Name:   KHUSHBOO CHUCK Date of Exam: 01/03/2024 Medical Rec #:  [id number]         Height:       62.0 in Accession #:    [id number]        Weight:       138.9 lb Date of Birth:  1975-10-12         BSA:          1.637 m Patient Age:    47 years          BP:           133/82 mmHg Patient  Gender: F                 HR:           66 bpm. Exam Location:  Inpatient Procedure: 2D Echo, Cardiac Doppler and Color Doppler (Both Spectral and Color            Flow Doppler were utilized during procedure). Indications:    Stroke  History:        Patient has no prior history of Echocardiogram examinations.  Sonographer:    Juanita Shaw Referring Phys: [id number] [facility] KHALIQDINA IMPRESSIONS  1. Left ventricular ejection fraction, by estimation, is 50 to 55%. Left ventricular ejection fraction by 2D MOD biplane is 54.0 %. The left ventricle has low normal function. The left ventricle has no regional wall motion abnormalities. Left ventricular diastolic parameters were normal.  2. Right ventricular systolic function is normal. The right ventricular size is normal. Tricuspid regurgitation signal is inadequate for assessing PA pressure.  3. The mitral valve is normal in structure. No evidence of mitral valve regurgitation. No evidence of mitral stenosis.  4. The aortic valve is tricuspid. There is mild calcification of the aortic valve. Aortic valve regurgitation is not visualized. No aortic stenosis is present.  5. The inferior vena cava is normal in size with greater than 50% respiratory variability, suggesting right atrial pressure of 3 mmHg. FINDINGS  Left Ventricle: Left ventricular ejection fraction, by estimation, is 50 to 55%. Left ventricular ejection fraction by 2D MOD biplane is 54.0 %. The left ventricle has low normal function. The left ventricle has no regional wall motion abnormalities. The left ventricular internal cavity size was normal in size. There is no left ventricular hypertrophy. Left ventricular diastolic parameters were normal. Right Ventricle: The right ventricular size is normal. No increase in right ventricular wall thickness. Right ventricular systolic function is normal. Tricuspid regurgitation signal is inadequate for assessing PA pressure. Left Atrium: Left atrial size was normal in  size. Right Atrium: Right atrial size was normal in size. Pericardium: There is no evidence of pericardial effusion. Mitral Valve: The mitral valve is normal in structure. No evidence of mitral valve regurgitation. No evidence of mitral valve stenosis. MV peak gradient, 1.3 mmHg. The mean mitral valve gradient is 1.0 mmHg. Tricuspid Valve: The tricuspid valve is normal in structure. Tricuspid valve regurgitation is not demonstrated. Aortic Valve: The aortic valve is tricuspid. There is mild calcification of the aortic valve. Aortic valve regurgitation is not visualized. No aortic stenosis is present. Aortic valve mean gradient measures 3.0 mmHg. Aortic valve peak gradient measures 5.3 mmHg. Aortic valve area, by VTI measures 2.48 cm. Pulmonic Valve: The pulmonic valve was normal in structure. Pulmonic valve regurgitation is  not visualized. Aorta: The aortic root is normal in size and structure. Venous: The inferior vena cava is normal in size with greater than 50% respiratory variability, suggesting right atrial pressure of 3 mmHg. IAS/Shunts: No atrial level shunt detected by color flow Doppler.  LEFT VENTRICLE PLAX 2D                        Biplane EF (MOD) LVIDd:         3.70 cm         LV Biplane EF:   Left LVIDs:         2.40 cm                          ventricular LV PW:         0.70 cm                          ejection LV IVS:        0.80 cm                          fraction by LVOT diam:     1.80 cm                          2D MOD LV SV:         56                               biplane is LV SV Index:   34                               54.0 %. LVOT Area:     2.54 cm                                Diastology                                LV e' medial:    10.90 cm/s LV Volumes (MOD)               LV E/e' medial:  6.2 LV vol d, MOD    108.0 ml      LV e' lateral:   9.57 cm/s A2C:                           LV E/e' lateral: 7.0 LV vol d, MOD    104.0 ml A4C: LV vol s, MOD    46.8 ml A2C: LV vol s, MOD    49.4  ml A4C: LV SV MOD A2C:   61.2 ml LV SV MOD A4C:   104.0 ml LV SV MOD BP:    57.1 ml RIGHT VENTRICLE             IVC RV Basal diam:  3.20 cm     IVC diam: 1.00 cm RV Mid diam:    2.70 cm RV S prime:     10.40 cm/s TAPSE (M-mode): 2.3 cm LEFT ATRIUM  Index        RIGHT ATRIUM           Index LA diam:      2.00 cm 1.22 cm/m   RA Area:     14.60 cm LA Vol (A2C): 20.7 ml 12.64 ml/m  RA Volume:   35.10 ml  21.44 ml/m LA Vol (A4C): 26.8 ml 16.37 ml/m  AORTIC VALVE                    PULMONIC VALVE AV Area (Vmax):    2.17 cm     PV Vmax:       0.64 m/s AV Area (Vmean):   2.07 cm     PV Peak grad:  1.6 mmHg AV Area (VTI):     2.48 cm AV Vmax:           115.00 cm/s AV Vmean:          75.400 cm/s AV VTI:            0.225 m AV Peak Grad:      5.3 mmHg AV Mean Grad:      3.0 mmHg LVOT Vmax:         98.20 cm/s LVOT Vmean:        61.300 cm/s LVOT VTI:          0.219 m LVOT/AV VTI ratio: 0.97  AORTA Ao Root diam: 3.10 cm Ao Asc diam:  2.80 cm MITRAL VALVE MV Area (PHT): 5.16 cm    SHUNTS MV Area VTI:   3.27 cm    Systemic VTI:  0.22 m MV Peak grad:  1.3 mmHg    Systemic Diam: 1.80 cm MV Mean grad:  1.0 mmHg MV Vmax:       0.58 m/s MV Vmean:      35.2 cm/s MV Decel Time: 147 msec MV E velocity: 67.10 cm/s MV A velocity: 51.90 cm/s MV E/A ratio:  1.29 Dalton McleanMD Electronically signed by Archer Bear Signature Date/Time: 01/03/2024/1:43:11 PM    Final    CT HEAD POST STROKE FOLLOWUP/TIMED/STAT READ Result Date: 01/02/2024 CLINICAL DATA:  Follow-up examination for stroke. EXAM: CT HEAD WITHOUT CONTRAST TECHNIQUE: Contiguous axial images were obtained from the base of the skull through the vertex without intravenous contrast. RADIATION DOSE REDUCTION: This exam was performed according to the departmental dose-optimization program which includes automated exposure control, adjustment of the mA and/or kV according to patient size and/or use of iterative reconstruction technique. COMPARISON:  Prior CT from  earlier the same day. FINDINGS: Brain: Cerebral volume within normal limits. Previously identified small left MCA distribution cortical infarct not well visualized by CT. No other acute large vessel territory infarct. No acute intracranial hemorrhage. No mass lesion or midline shift. No hydrocephalus or extra-axial fluid collection. Vascular: No abnormal hyperdense vessel. Skull: Scalp soft tissues within normal limits.  Calvarium intact. Sinuses/Orbits: Globes orbital soft tissues within normal limits. Paranasal sinuses remain clear. No mastoid effusion. Other: None. IMPRESSION: 1. Previously identified small left frontal cortical infarct not well visualized by CT. 2. No other new acute intracranial abnormality. No intracranial hemorrhage. Electronically Signed   By: Virgia Griffins M.D.   On: 01/02/2024 22:01   MR BRAIN WO CONTRAST Result Date: 01/02/2024 CLINICAL DATA:  Neuro deficit, concern for stroke, acute onset aphasia. EXAM: MRI HEAD WITHOUT CONTRAST TECHNIQUE: Multiplanar, multiecho pulse sequences of the brain and surrounding structures were obtained without intravenous contrast. COMPARISON:  Same day CT head and CTA  head and neck. FINDINGS: Brain: Focal restricted diffusion involving the cortex of the left frontal operculum within the left inferior frontal gyrus compatible with acute infarct. No evidence of intracranial hemorrhage. White matter is unremarkable. No edema or midline shift. Posterior fossa is unremarkable. Normal appearance of midline structures. The basilar cisterns are patent. No extra-axial fluid collections. Ventricles: Normal size and configuration of the ventricles. Vascular: Abnormal appearance of the left ICA flow void corresponding to findings of dissection on same day CTA. Skull and upper cervical spine: No focal abnormality. Sinuses/Orbits: Orbits are symmetric. Paranasal sinuses are clear. Other: Mastoid air cells are clear. IMPRESSION: Focal acute infarct involving  cortex within the left frontal operculum. Abnormal left ICA flow void compatible with findings of dissection on CTA. Electronically Signed   By: Denny Flack M.D.   On: 01/02/2024 19:14   CT ANGIO HEAD NECK W WO CM (CODE STROKE) Result Date: 01/02/2024 CLINICAL DATA:  Neuro deficit, concern for stroke, sudden onset aphasia. EXAM: CT ANGIOGRAPHY HEAD AND NECK CT PERFUSION BRAIN TECHNIQUE: Multidetector CT imaging of the head and neck was performed using the standard protocol during bolus administration of intravenous contrast. Multiplanar CT image reconstructions and MIPs were obtained to evaluate the vascular anatomy. Carotid stenosis measurements (when applicable) are obtained utilizing NASCET criteria, using the distal internal carotid diameter as the denominator. Multiphase CT imaging of the brain was performed following IV bolus contrast injection. Subsequent parametric perfusion maps were calculated using RAPID software. RADIATION DOSE REDUCTION: This exam was performed according to the departmental dose-optimization program which includes automated exposure control, adjustment of the mA and/or kV according to patient size and/or use of iterative reconstruction technique. CONTRAST:  75mL OMNIPAQUE  IOHEXOL  350 MG/ML SOLN COMPARISON:  Same-day head CT. FINDINGS: CTA NECK FINDINGS Aortic arch: Standard configuration of the aortic arch. Imaged portion shows no evidence of aneurysm or dissection. No significant stenosis of the major arch vessel origins. Pulmonary arteries: As permitted by contrast timing, there are no filling defects in the visualized pulmonary arteries. Subclavian arteries: The subclavian arteries are patent bilaterally. Right carotid system: No evidence of dissection, stenosis (50% or greater), or occlusion. Left carotid system: The common carotid artery is patent to the ICA bifurcation. Proximal cervical ICA is relatively normal in caliber with abrupt caliber change of the vessel at  approximately the level of C2. There is severe narrowing and significant irregularity of the mid and distal cervical ICA. Possible 1-2 mm short segment occlusion of the distal cervical ICA with reconstitution of the vessel. Vertebral arteries: Codominant. No evidence of dissection, stenosis (50% or greater), or occlusion. Skeleton: No acute or aggressive finding. Disc space narrowing noted at C5-6. Other neck: The visualized airway is patent. No cervical lymphadenopathy. Upper chest: Visualized lung apices are clear. Review of the MIP images confirms the above findings CTA HEAD FINDINGS ANTERIOR CIRCULATION: There is severe narrowing and possible occlusion at the posterior vertical portion of the left petrous ICA. There is reconstitution of the horizontal left petrous ICA which appears diminished in caliber and irregular with diminished intraluminal contrast. There is additional diminished intraluminal contrast within the cavernous and supraclinoid ICAs without significant irregularity of the vessel. The right ICA is patent from the skull base to the ICA terminus without significant atherosclerosis or stenosis. MCAs: The middle cerebral arteries are patent bilaterally. ACAs: The anterior cerebral arteries are patent bilaterally. POSTERIOR CIRCULATION: No significant stenosis, proximal occlusion, aneurysm, or vascular malformation. PCAs: The posterior cerebral arteries are patent bilaterally. Pcomm: Not well  visualized. SCAs: The superior cerebellar arteries are patent bilaterally. Basilar artery: Patent AICAs: Not well visualized. PICAs: Patent Vertebral arteries: Patent bilaterally. Tortuosity of the left V4 segment. Venous sinuses: As permitted by contrast timing, patent. Anatomic variants: None Review of the MIP images confirms the above findings CT Brain Perfusion Findings: ASPECTS: 10 CBF (<30%) Volume: 0mL Perfusion (Tmax>6.0s) volume: 0mL Mismatch Volume: 0mL Infarction Location:None identified IMPRESSION:  Irregularity and severe narrowing of the mid and distal left cervical ICA concerning for dissection. There is a short approximately 1-2 mm segment of occlusion of the distal left cervical ICA with reconstitution proximal to the skull base. Additional significant irregularity and likely focal occlusion at the posterior vertical portion of the left petrous ICA. Reconstitution of the horizontal left petrous ICA which is diminutive in caliber and irregularity concerning for intracranial propagation of dissection. Diminished intraluminal contrast within the left cavernous and left supraclinoid ICA without significant irregularity. Arterial vasculature in the head and neck is otherwise patent. No core infarct identified on CT perfusion. These results were called by telephone at the time of interpretation on 01/02/2024 at 6:40 pm to provider [facility] , who verbally acknowledged these results. Electronically Signed   By: Denny Flack M.D.   On: 01/02/2024 19:03   CT CEREBRAL PERFUSION W CONTRAST Result Date: 01/02/2024 CLINICAL DATA:  Neuro deficit, concern for stroke, sudden onset aphasia. EXAM: CT ANGIOGRAPHY HEAD AND NECK CT PERFUSION BRAIN TECHNIQUE: Multidetector CT imaging of the head and neck was performed using the standard protocol during bolus administration of intravenous contrast. Multiplanar CT image reconstructions and MIPs were obtained to evaluate the vascular anatomy. Carotid stenosis measurements (when applicable) are obtained utilizing NASCET criteria, using the distal internal carotid diameter as the denominator. Multiphase CT imaging of the brain was performed following IV bolus contrast injection. Subsequent parametric perfusion maps were calculated using RAPID software. RADIATION DOSE REDUCTION: This exam was performed according to the departmental dose-optimization program which includes automated exposure control, adjustment of the mA and/or kV according to patient size and/or use of  iterative reconstruction technique. CONTRAST:  75mL OMNIPAQUE  IOHEXOL  350 MG/ML SOLN COMPARISON:  Same-day head CT. FINDINGS: CTA NECK FINDINGS Aortic arch: Standard configuration of the aortic arch. Imaged portion shows no evidence of aneurysm or dissection. No significant stenosis of the major arch vessel origins. Pulmonary arteries: As permitted by contrast timing, there are no filling defects in the visualized pulmonary arteries. Subclavian arteries: The subclavian arteries are patent bilaterally. Right carotid system: No evidence of dissection, stenosis (50% or greater), or occlusion. Left carotid system: The common carotid artery is patent to the ICA bifurcation. Proximal cervical ICA is relatively normal in caliber with abrupt caliber change of the vessel at approximately the level of C2. There is severe narrowing and significant irregularity of the mid and distal cervical ICA. Possible 1-2 mm short segment occlusion of the distal cervical ICA with reconstitution of the vessel. Vertebral arteries: Codominant. No evidence of dissection, stenosis (50% or greater), or occlusion. Skeleton: No acute or aggressive finding. Disc space narrowing noted at C5-6. Other neck: The visualized airway is patent. No cervical lymphadenopathy. Upper chest: Visualized lung apices are clear. Review of the MIP images confirms the above findings CTA HEAD FINDINGS ANTERIOR CIRCULATION: There is severe narrowing and possible occlusion at the posterior vertical portion of the left petrous ICA. There is reconstitution of the horizontal left petrous ICA which appears diminished in caliber and irregular with diminished intraluminal contrast. There is additional diminished intraluminal contrast  within the cavernous and supraclinoid ICAs without significant irregularity of the vessel. The right ICA is patent from the skull base to the ICA terminus without significant atherosclerosis or stenosis. MCAs: The middle cerebral arteries are  patent bilaterally. ACAs: The anterior cerebral arteries are patent bilaterally. POSTERIOR CIRCULATION: No significant stenosis, proximal occlusion, aneurysm, or vascular malformation. PCAs: The posterior cerebral arteries are patent bilaterally. Pcomm: Not well visualized. SCAs: The superior cerebellar arteries are patent bilaterally. Basilar artery: Patent AICAs: Not well visualized. PICAs: Patent Vertebral arteries: Patent bilaterally. Tortuosity of the left V4 segment. Venous sinuses: As permitted by contrast timing, patent. Anatomic variants: None Review of the MIP images confirms the above findings CT Brain Perfusion Findings: ASPECTS: 10 CBF (<30%) Volume: 0mL Perfusion (Tmax>6.0s) volume: 0mL Mismatch Volume: 0mL Infarction Location:None identified IMPRESSION: Irregularity and severe narrowing of the mid and distal left cervical ICA concerning for dissection. There is a short approximately 1-2 mm segment of occlusion of the distal left cervical ICA with reconstitution proximal to the skull base. Additional significant irregularity and likely focal occlusion at the posterior vertical portion of the left petrous ICA. Reconstitution of the horizontal left petrous ICA which is diminutive in caliber and irregularity concerning for intracranial propagation of dissection. Diminished intraluminal contrast within the left cavernous and left supraclinoid ICA without significant irregularity. Arterial vasculature in the head and neck is otherwise patent. No core infarct identified on CT perfusion. These results were called by telephone at the time of interpretation on 01/02/2024 at 6:40 pm to provider [facility] , who verbally acknowledged these results. Electronically Signed   By: Denny Flack M.D.   On: 01/02/2024 19:03   CT HEAD CODE STROKE WO CONTRAST Result Date: 01/02/2024 CLINICAL DATA:  Code stroke.  Neuro deficit, concern for stroke. EXAM: CT HEAD WITHOUT CONTRAST TECHNIQUE: Contiguous axial images  were obtained from the base of the skull through the vertex without intravenous contrast. RADIATION DOSE REDUCTION: This exam was performed according to the departmental dose-optimization program which includes automated exposure control, adjustment of the mA and/or kV according to patient size and/or use of iterative reconstruction technique. COMPARISON:  None Available. FINDINGS: Brain: No acute intracranial hemorrhage. No CT evidence of acute infarct. No edema, mass effect, or midline shift. The basilar cisterns are patent. Ventricles: The ventricles are normal. Vascular: No hyperdense vessel or unexpected calcification. Skull: No acute or aggressive finding. Orbits: Orbits are symmetric. Sinuses: The visualized paranasal sinuses are clear. Other: Mastoid air cells are clear. ASPECTS [facility] Stroke Program Early CT Score) - Ganglionic level infarction (caudate, lentiform nuclei, internal capsule, insula, M1-M3 cortex): 7 - Supraganglionic infarction (M4-M6 cortex): 3 Total score (0-10 with 10 being normal): 10 IMPRESSION: 1. No CT evidence of acute intracranial abnormality. 2. ASPECTS is 10 These results were communicated to Dr. Murvin Arthurs at 5:26 pm on 01/02/2024 by text page via the [facility] messaging system. Electronically Signed   By: Denny Flack M.D.   On: 01/02/2024 17:26    Microbiology: Results for orders placed or performed during the hospital encounter of 01/02/24  SARS Coronavirus 2 by RT PCR (hospital order, performed in [facility] hospital lab) *cepheid single result test* Anterior Nasal Swab     Status: None   Collection Time: 01/02/24  6:45 PM   Specimen: Anterior Nasal Swab  Result Value Ref Range Status   SARS Coronavirus 2 by RT PCR NEGATIVE NEGATIVE Final    Comment: Performed at [facility], [address], [place], [place] [address]  MRSA Next Gen by PCR, Nasal     Status: None   Collection Time: 01/02/24  7:45 PM   Specimen: Nasal Mucosa; Nasal Swab  Result Value Ref  Range Status   MRSA by PCR Next Gen NOT DETECTED NOT DETECTED Final    Comment: (NOTE) The GeneXpert MRSA Assay (FDA approved for NASAL specimens only), is one component of a comprehensive MRSA colonization surveillance program. It is not intended to diagnose MRSA infection nor to guide or monitor treatment for MRSA infections. Test performance is not FDA approved in patients less than 62 years old. Performed at [facility], [address], [place], [place] [address]     Labs: CBC: Recent Labs  Lab 01/19/24 2112 01/19/24 2135 01/20/24 0414  WBC 6.0  --  5.7  NEUTROABS 3.0  --  2.8  HGB 13.3 13.3 12.4  HCT 40.0 39.0 37.6  MCV 85.1  --  85.5  PLT 270  --  253   Basic Metabolic Panel: Recent Labs  Lab 01/19/24 2112 01/19/24 2135 01/20/24 0414  NA 136 138 136  K 3.5 3.4* 3.9  CL 104 101 104  CO2 22  --  24  GLUCOSE 89 87 80  BUN 9 8 8   CREATININE 0.76 0.80 0.69  CALCIUM  9.3  --  8.8*  MG  --   --  2.0   Liver Function Tests: Recent Labs  Lab 01/19/24 2112 01/20/24 0414  AST 29 26  ALT 25 21  ALKPHOS 39 38  BILITOT 0.5 0.7  PROT 7.0 6.3*  ALBUMIN 4.2 3.7   CBG: No results for input(s): "GLUCAP" in the last 168 hours.  Discharge time spent: less than 30 minutes.  Signed: Lena Qualia, MD [facility] 01/20/2024

## 2024-01-20 NOTE — Addendum Note (Signed)
 Addended by: Michalene Agee on: 01/20/2024 08:26 AM   Modules accepted: Orders

## 2024-01-20 NOTE — Telephone Encounter (Signed)
 Answered most of the husband questions.  Advised Dr.Jaffe is out of the office today.   Front desk please schedule a work in next week.

## 2024-01-21 ENCOUNTER — Telehealth: Payer: Self-pay

## 2024-01-21 NOTE — Progress Notes (Addendum)
 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 Brutus Caprice, endovascular neurosurgery at [facility].  Will send urgent referral and hold off referral to endovascular radiology here for now.   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 Repeat CTA head and neck in 2-3 months Gentle neck movements, no jarring neck movements, do not lift more than 15 lbs For headache/neck pain, continue gabapentin  300mg  three times daily. Follow up in August as scheduled.   Total time spent in chart, reviewing imaging and face to face with patient:  44 minutes  Subjective:  Patty [name] is a 48 year old right-handed female with chronic back pain and sacroiliac pain but otherwise no significant past medical history who follows up for recent hospitalization for carotid artery dissection (01/19/2024 to 01/20/2024).  History supplemented by hospital records.  She is accompanied by her husband who also supplements history.  UPDATE: Current medication:  ASA 81mg  daily, Plavix  75mg  daily, Lopressor  12.5mg  BID, atorvastatin  40mg  daily, gabapentin  300mg  three times daily.  Due to ongoing headaches, gabapentin  was increased to 300mg  three times daily.  She started noticing that her throat felt swollen and was having difficulty swallowing food.  As she reported new right-sided pulsatile tinnitus, outpatient CTA of head and neck was repeated on 01/13/2024 to evaluate for new right ICA dissection.  Report was  not read until 5/28 which demonstrated concern for new right ICA dissection proximal to petrous portion.  She saw speech therapy several days before, but the swallowing difficulty started afterwards.  Plan was for modified barium swallow and referral to endovascular radiology.  That night, her blood pressure was running high (140s-150s systolic) and left facial tingling.  She was advised to go to the hospital.  MRI of brain did not reveal new stroke.  Advised to continue DAPT and statin.  Due to concern on CTA of questionable adherent thrombus at the left ICA during the healing process, advised to hold off on angiogram for 1 to 2 months.  Autoimmune labs for connective tissue disease/vasculitis (ANA, ANCA and ENA panel) were negative.  Since discharge, she has been feeling better.  Headaches improved.  Not experiencing pulsatile tinnitus.  Dizziness improved.     HISTORY: In late April 2025, she began experiencing head pressure and neck tightness as well as whooshing sound in her left ear.  On 01/02/2024, the headache was worse, diffuse pressure, eyelids heavy.  She tried watering the plants but was spilling the water.  She had a snack but felt nauseous.  She then had trouble getting words out, effortful and stuttering speech.  There was questionable facial asymmetry when she smiled but no obvious facial droop.  .  No focal extremity weakness or numbness.  She was brought by EMS to [facility] where she was noted STAT MRI of brain revealed acute infarct in the left frontal operculum.  She received tnkase .  CTA head and neck revealed left ICA dissection.  2D echo showed EF 50-55% with no atrial level shunt.  LDL 96 and Hgb A1c 4.9. UDS negative.  She was discharged on ASA 81mg  and Plavix  75mg  daily for 3 months followed by ASA alone.  She was also discharged on Lipitor 40mg .  Prior to discharge, she started experiencing pulsatile tinnitus in her right ear.   Since discharge, she continues to have head  pressure and neck pain with some sensitivity to light and sound.  Language is improved but sometimes may miss a word or stutter momentarily when speaking.  She was already taking gabapentin  200mg  at bedtime for her chronic back pain, which was increased to 100mg  in AM, 100mg  afternoon and 200mg  at bedtime last week.  She still notes pulsatile tinnitus in the right ear exacerbated when laying on her right side.  Blood pressure at home reportedly 120s SBP.   Works as a Production designer, theatre/television/film at [employer].  On her feet often but no heavy lifting.   01/02/2024 CT HEAD WO:  1. No CT evidence of acute intracranial abnormality. 2. ASPECTS is 10 01/02/2024 MRI BRAIN WO:  Focal acute infarct involving cortex within the left frontal operculum.  Abnormal left ICA flow void compatible with findings of dissection on CTA. 01/02/2024 CTA HEAD &  NECK:  Irregularity and severe narrowing of the mid and distal left cervical ICA concerning for dissection. There is a short approximately 1-2 mm segment of occlusion of the distal left cervical ICA with reconstitution proximal to the skull base.  Additional significant irregularity and likely focal occlusion at the posterior vertical portion of the left petrous ICA. Reconstitution of the horizontal left petrous ICA which is diminutive in caliber and irregularity concerning for intracranial propagation of dissection.  Diminished intraluminal contrast within the left cavernous and left supraclinoid ICA without significant irregularity.  Arterial vasculature in the head and neck is otherwise patent. 01/02/2024 CT PERFUSION:  No core infarct identified on CT perfusion.  01/02/2024 CT HEAD POST STROKE FOLLOW-UP:  1. Previously identified small left frontal cortical infarct not well visualized by CT. 2. No other new acute intracranial abnormality. No intracranial hemorrhage. 01/03/2024 MRI BRAIN POST STROKE FOLLOW-UP:  1. New, small focus of acute ischemia within the left frontal lobe, slightly  superior to the previously demonstrated location within the left frontal operculum. No hemorrhage or mass effect. 2. Abnormal left ICA flow void, unchanged. This remains concerning for dissection  PAST MEDICAL HISTORY: Past Medical History:  Diagnosis Date   Chronic back pain    officially undiagnosed   Stroke [facility])     MEDICATIONS: Current Outpatient Medications on File Prior to Visit  Medication Sig Dispense Refill   acetaminophen  (TYLENOL ) 500 MG tablet Take 1,000 mg by mouth every 6 (six) hours as needed for mild pain (pain score 1-3), moderate pain (pain score 4-6) or headache.     aspirin  EC 81 MG tablet Take 1 tablet (81 mg total) by mouth daily. Swallow whole. 30 tablet 12   atorvastatin  (LIPITOR) 40 MG tablet Take 1 tablet (40 mg total) by mouth daily. 30 tablet 2   clopidogrel  (PLAVIX ) 75 MG tablet Take 1 tablet (75 mg total) by mouth daily. 30 tablet 2   gabapentin  (NEURONTIN ) 300 MG capsule Take 1 capsule (300 mg total) by mouth 3 (three) times daily. 90 capsule 5   magnesium  oxide (MAG-OX) 400 (240 Mg) MG tablet Take 400 mg by mouth at bedtime.     metoprolol  tartrate (LOPRESSOR ) 25 MG tablet Take 0.5 tablets (12.5 mg total) by mouth 2 (two) times daily. 30 tablet 1  venlafaxine  XR (EFFEXOR -XR) 37.5 MG 24 hr capsule TAKE 1 CAPSULE BY MOUTH DAILY WITH BREAKFAST. 90 capsule 1   No current facility-administered medications on file prior to visit.    ALLERGIES: No Known Allergies  FAMILY HISTORY: No family history on file.    Objective:  Blood pressure (!) 137/91, pulse 73, height 5\' 6"  (1.676 m), weight 138 lb 12.8 oz (63 kg), last menstrual period 01/05/2024, SpO2 100%. General: No acute distress.  Patient appears well-groomed.   Head:  Normocephalic/atraumatic Eyes:  Fundi examined but not visualized Neck: supple, no paraspinal tenderness, full range of motion Heart:  Regular rate and rhythm Vascular:  Bilateral bruits Neurological Exam: alert and oriented.   Speech fluent and not dysarthric, language intact.  Mildly reduced left V1-V2.  Otherwise, CN II-XII intact. Bulk and tone normal, muscle strength 5/5 throughout.  Sensation to pinprick and vibration intact.  Deep tendon reflexes 2+ throughout, toes downgoing.  Finger to nose testing intact.  Gait normal, Romberg negative.   Janne Members, DO  CC: Chyrel Craw, NP

## 2024-01-21 NOTE — Telephone Encounter (Signed)
 Pt called to see if see if she remembered Dr Festus Hubert talking to her with CTA results she said she did but she was talking about other information that was not addressed  Patty [name] to [facility] (supporting Patty Abbey, DO) AP    01/18/24  3:39 PM I forgot to include that my throat is tight and feels swollen when swallow. It has made swallowing food difficult and has sometimes impacted my speech.   01/18/24  3:11 PM Patty [name], CMA routed this conversation to Patty Abbey, DO Patty [name] to [facility] (supporting Patty Abbey, DO) AP    01/18/24  2:52 PM I have been tracking BP and symptoms which include BP in the 140s & 50s over 90s during the daytime. I have also had tingling/numbness in my left cheek and left roof of mouth the last couple of days. Dizziness/pressure in head seems to increase when BP is raised.  Please advise with any recommendations.

## 2024-01-21 NOTE — Telephone Encounter (Signed)
 Transition Care Management Follow-up Telephone Call     Date discharged? 01/20/24   How have you been since you were released from the hospital? She has been ok    Any patient concerns? her question was does she need to keep her BP below 130/90 while on the medication like she was talked about with Dr Festus Hubert last week on Wednesday? Talked to Dr Festus Hubert informed Pt that he wants her to keep her BP normal below 130/90 while on BP medication pt verbalized understanding,   Pt is upset that she never heard anything back from her mychart message on 01/18/24 before she went to the hospital when she told Dr Festus Hubert about what was going on, she was asking about the disconnect in care? Why she wasn't answered?      Items Reviewed: Medications reviewed:  acetaminophen  500 MG tablet Commonly known as: TYLENOL  Take 1,000 mg by mouth every 6 (six) hours as needed for mild pain (pain score 1-3), moderate pain (pain score 4-6) or headache.    aspirin  EC 81 MG tablet Take 1 tablet (81 mg total) by mouth daily. Swallow whole.    atorvastatin  40 MG tablet Commonly known as: LIPITOR Take 1 tablet (40 mg total) by mouth daily.    clopidogrel  75 MG tablet Commonly known as: PLAVIX  Take 1 tablet (75 mg total) by mouth daily.    gabapentin  300 MG capsule Commonly known as: NEURONTIN  Take 1 capsule (300 mg total) by mouth 3 (three) times daily.    magnesium oxide 400 (240 Mg) MG tablet Commonly known as: MAG-OX Take 400 mg by mouth at bedtime.    metoprolol tartrate 25 MG tablet Commonly known as: LOPRESSOR Take 0.5 tablets (12.5 mg total) by mouth 2 (two) times daily.    venlafaxine  XR 37.5 MG 24 hr capsule Commonly known as: EFFEXOR -XR TAKE 1 CAPSULE BY MOUTH DAILY WITH BREAKFAST.     Allergies reviewed: no allergies  Dietary changes reviewed: pt was on heart healthy while in the hospital not at home Referrals reviewed: PCP and Dr Festus Hubert and Dr Festus Hubert would send to Dr Harvey Linen      Functional  Questionnaire:  Independent - I Dependent - D    Activities of Daily Living (ADLs):     Personal hygiene - I Dressing - I Eating - I  still has trouble swallowing but ok eating  Maintaining continence - I Transferring - I   Independent Activities of Daily Living (iADLs): Basic communication skills - I Transportation - I Meal preparation  - I Shopping - She has been to tired but she could if she needed too Housework - In small increments  Managing medications - I Managing personal finances - D her husband does that    Confirmed importance and date/time of follow-up visits scheduled June 2nd at 1:10 pm Provider Appointment booked with Dr Festus Hubert    Confirmed with patient if condition begins to worsen call PCP or go to the ER.  Patient was given the office number and encouraged to call back with question or concerns: Yes

## 2024-01-21 NOTE — Telephone Encounter (Signed)
 Note:  MyChart message from 5/27 inquired about CTA results.  Once we received CTA results, I did in fact personally call the patient and discuss results and her concerns as well as the plan going forward.  This is when we discussed blood pressure parameters.

## 2024-01-24 ENCOUNTER — Ambulatory Visit (INDEPENDENT_AMBULATORY_CARE_PROVIDER_SITE_OTHER): Admitting: Neurology

## 2024-01-24 ENCOUNTER — Encounter: Payer: Self-pay | Admitting: Neurology

## 2024-01-24 VITALS — BP 137/91 | HR 73 | Ht 66.0 in | Wt 138.8 lb

## 2024-01-24 DIAGNOSIS — I7771 Dissection of carotid artery: Secondary | ICD-10-CM

## 2024-01-24 DIAGNOSIS — I63032 Cerebral infarction due to thrombosis of left carotid artery: Secondary | ICD-10-CM

## 2024-01-24 DIAGNOSIS — R030 Elevated blood-pressure reading, without diagnosis of hypertension: Secondary | ICD-10-CM | POA: Diagnosis not present

## 2024-01-24 NOTE — Patient Instructions (Addendum)
 Continue aspirin  81mg  daily and Plavix  75mg  daily Continue statin therapy Continue metoprolol .  Follow up with PCP Continue gabapentin  Refer to Dr. Ali Zomorodi (endovascular neurosurgery) at [facility].  Contact me in one week with update on appointment Keep appointment in August.

## 2024-01-25 ENCOUNTER — Telehealth: Payer: Self-pay

## 2024-01-25 ENCOUNTER — Telehealth: Payer: Self-pay | Admitting: Neurology

## 2024-01-25 DIAGNOSIS — I7771 Dissection of carotid artery: Secondary | ICD-10-CM

## 2024-01-25 NOTE — Telephone Encounter (Signed)
 Patient advised of referral faxed over 01/24/24 and refaxed 01/25/24

## 2024-01-25 NOTE — Telephone Encounter (Signed)
 LMOVM for patient, Lab ordered. Lab form at the front desk. Patient to go to [facility].

## 2024-01-25 NOTE — Telephone Encounter (Signed)
 Pt called to get her records/imaging sent/shared to [facility]. She said Dr.Jaffe sent a referral to [facility] and they use [employer]

## 2024-02-05 LAB — FAMILIAL AORTOPATHY PANEL

## 2024-02-11 ENCOUNTER — Ambulatory Visit: Attending: Neurology | Admitting: Speech Pathology

## 2024-02-11 ENCOUNTER — Ambulatory Visit: Payer: Self-pay | Admitting: Neurology

## 2024-02-11 DIAGNOSIS — R471 Dysarthria and anarthria: Secondary | ICD-10-CM | POA: Insufficient documentation

## 2024-02-11 DIAGNOSIS — R4701 Aphasia: Secondary | ICD-10-CM | POA: Diagnosis present

## 2024-02-11 NOTE — Therapy (Signed)
 OUTPATIENT SPEECH LANGUAGE PATHOLOGY TREATMENT NOTE   Patient Name: Patty [name] MRN: [id number] DOB:06/05/1976, 48 y.o., female Today's Date: 02/11/2024  PCP: Joneen Nelson, PA-C REFERRING PROVIDER: Consuelo Denmark ,MD  END OF SESSION:  End of Session - 02/11/24 0958     Visit Number 3    Number of Visits 9    Date for SLP Re-Evaluation 03/18/24    Authorization Type [employer]    SLP Start Time 0924    SLP Stop Time  0958    SLP Time Calculation (min) 34 min    Activity Tolerance Patient tolerated treatment well           Past Medical History:  Diagnosis Date   Chronic back pain    officially undiagnosed   Stroke [facility])    No past surgical history on file. Patient Active Problem List   Diagnosis Date Noted   History of dissection of internal carotid artery 01/20/2024   Dissection of right carotid artery (HCC) 01/20/2024   Essential hypertension 01/20/2024   History of CVA with residual deficit 01/20/2024   Hyperlipidemia 01/20/2024   Internal carotid artery dissection (HCC) 01/20/2024   Acute ischemic left MCA stroke (HCC) 01/02/2024   SI (sacroiliac) joint dysfunction 09/02/2021   Somatic dysfunction of spine, sacral 09/02/2021    ONSET DATE: 01/02/2024   REFERRING DIAG: I63.9 (ICD-10-CM) - Acute ischemic stroke (HCC)   THERAPY DIAG:  Aphasia  Dysarthria and anarthria  Rationale for Evaluation and Treatment: Rehabilitation  SUBJECTIVE:   SUBJECTIVE STATEMENT: doing much better Pt accompanied by: self and significant other  PERTINENT HISTORY: 48 y.o. female presenting 5/11 with difficulty speaking. MRI brain demonstrated diffusion restriction in the left frontal operculum concerning for an acute stroke. S/p TNK. 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 PMH significant for chronic back  pain and sacroiliac pain.   PAIN:  Are you having pain? Yes: NPRS scale: 5/10 Pain location: head Pain description: headache Aggravating factors: n/a Relieving factors: n/a  FALLS: Has patient fallen in last 6 months?  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                                                                                                                        TREATMENT DATE:  02/11/24: Pt reports ongoing resolution of dysphagia, motor speech, and expressive language sx. She is effectively communicating across communication partners and situations. Mild impairments reported with processing complex or extensive information, worse with fatigue. SLP provided pt education regarding processing strategies including self-advocacy, asking  of information in writing, reducing distractions. Pt has returned to full range diet with min globus sensation, primarily with fatigue. No ssx aspiration reported. Pt requesting d/c form therapy, is pleased with current functional status.   SPEECH THERAPY DISCHARGE SUMMARY  Visits from Start of Care: 3  Current functional level related to goals / functional outcomes: Improving expressive language, dysarthria, dysphagia. Pt has returned to work part time.    Remaining deficits: Min deficits in motor speech, swallowing when fatigued. Min processing challenges with complex or extensive information.    Education / Equipment: dysarthria strategies and compensations, safe swallow strategies, aspiration precautions, cognitive compensations and strategies, functional implementation of cognitive strategies    Patient  agrees to discharge. Patient goals were met. Patient is being discharged due to being pleased with the current functional level.  01/14/24: Addressed motor planning via patient instruction for utilization of systematic practice for error'd productions. Advised pt to (1) slow down, repeat correctly x3, (2) increase rate, repeat correctly x3, (3) say correctly at normal speech rate x3. Discussed potential benefit from writing down challenging words for repetitive practice. Goal is to reinforce accurate motor planning for increased speech accuracy.   Addressed anomia via patient instruction for anomia strategies. Discussed benefit of increasing inputs to aid in word finding, ongoing communication efficacy, and strengthening of lexical connections. Led pt through practice describing moderately complex lexicon with pt demonstrating success in 5/5 trials.   Pt reports only swallowing complaint is dry mouth, likely 2/2 medication changes. Provided resource for xerostomia management strategies.   01/07/24: Education provided on evaluation results and SLP's recommendations. Pt verbalizes agreement with POC, all questions answered to satisfaction. Initiated training re: rehabilitative techniques for aphasia. Recommended asking for more time as needed, using intentional circumlocution as needed to express thoughts and ideas. Will plan to initiate HEP training first therapy session    PATIENT EDUCATION: Education details: POC goals Person educated: Patient and Spouse Education method: Medical illustrator Education comprehension: verbalized understanding and returned demonstration   GOALS: Goals reviewed with patient? Yes  SHORT TERM GOALS: Target date: 02/05/2024  Pt will complete clinical swallow evaluation first session Baseline: 01/14/24 Goal status: deferred, no complaints   2.  Pt will demonstrate understanding of x3 HEP activities  Baseline:  Goal status: not met, pt elected  d/c  LONG TERM GOALS: Target date: 03/18/24  Pt will complete dysphagia exercises targeting motor planning with mod-I Baseline:  Goal status: not met  2.  Pt will ID communication challenges in home and work environment and with A from SLP, generate solution  Baseline:  Goal status: not met  3.  Pt will complete complex structured language tasks with 90% accuracy  Baseline:  Goal status: met  4.  Pt will be independent with HEP Baseline:  Goal status: not met  ASSESSMENT:  CLINICAL IMPRESSION: Patient is a 48 y.o. F who was seen today for ST session targeting patient education for strategies and compensations to support ongoing recovery. Pt denies ongoing concerns requiring ST interventions. Speech noted to be clear and language fluent throughout session without evidence of anomia, dysnomia, or organization challenges. Will d/c at pt's request. Advised can return to therapy with new referral should needs change.   OBJECTIVE IMPAIRMENTS: include expressive language and dysarthria. These impairments are limiting patient from effectively communicating at home and in community. Factors affecting potential to achieve goals and functional outcome are none noted. Patient will benefit from skilled SLP services to address above impairments and improve  overall function.  REHAB POTENTIAL: Excellent  PLAN:  SLP FREQUENCY: 1x/week  SLP DURATION: 10 weeks  PLANNED INTERVENTIONS: Language facilitation, Cueing hierachy, Internal/external aids, Oral motor exercises, Functional tasks, Multimodal communication approach, SLP instruction and feedback, Compensatory strategies, Patient/family education, [phone] Treatment of speech (30 or 45 min) , and [id number]- Speech [address], [place], Phon, Eval Compre, Express  Alston Jerry, CCC-SLP 02/11/2024, 9:59 AM

## 2024-02-16 NOTE — Progress Notes (Deleted)
  [facility] [address] [place] [address] Phone: [phone] Subjective:    I'm seeing this patient by the request  of:  Cristopher Bottcher, NP  CC:   YEP:Dlagzrupcz  Patty [name] is a 48 y.o. female coming in with complaint of back and neck pain. OMT 01/07/2024. Patient states   Medications patient has been prescribed: Lopressor   Taking:         Reviewed prior external information including notes and imaging from previsou exam, outside providers and external EMR if available.   As well as notes that were available from care everywhere and other healthcare systems.  Past medical history, social, surgical and family history all reviewed in electronic medical record.  No pertanent information unless stated regarding to the chief complaint.   Past Medical History:  Diagnosis Date   Chronic back pain    officially undiagnosed   Stroke (HCC)     No Known Allergies   Review of Systems:  No headache, visual changes, nausea, vomiting, diarrhea, constipation, dizziness, abdominal pain, skin rash, fevers, chills, night sweats, weight loss, swollen lymph nodes, body aches, joint swelling, chest pain, shortness of breath, mood changes. POSITIVE muscle aches  Objective  Last menstrual period 01/05/2024.   General: No apparent distress alert and oriented x3 mood and affect normal, dressed appropriately.  HEENT: Pupils equal, extraocular movements intact  Respiratory: Patient's speak in full sentences and does not appear short of breath  Cardiovascular: No lower extremity edema, non tender, no erythema  Gait MSK:  Back   Osteopathic findings  C2 flexed rotated and side bent right C6 flexed rotated and side bent left T3 extended rotated and side bent right inhaled rib T9 extended rotated and side bent left L2 flexed rotated and side bent right Sacrum right on right       Assessment and Plan:  No problem-specific Assessment &  Plan notes found for this encounter.    Nonallopathic problems  Decision today to treat with OMT was based on Physical Exam  After verbal consent patient was treated with HVLA, ME, FPR techniques in cervical, rib, thoracic, lumbar, and sacral  areas  Patient tolerated the procedure well with improvement in symptoms  Patient given exercises, stretches and lifestyle modifications  See medications in patient instructions if given  Patient will follow up in 4-8 weeks             Note: This dictation was prepared with Dragon dictation along with smaller phrase technology. Any transcriptional errors that result from this process are unintentional.

## 2024-02-18 ENCOUNTER — Telehealth: Payer: Self-pay | Admitting: Neurology

## 2024-02-18 ENCOUNTER — Encounter: Admitting: Speech Pathology

## 2024-02-18 ENCOUNTER — Ambulatory Visit: Admitting: Family Medicine

## 2024-02-18 NOTE — Telephone Encounter (Signed)
 Pt. Needs PA for [facility] labs, Rep from [employer] can assist just call

## 2024-02-23 ENCOUNTER — Encounter: Payer: Self-pay | Admitting: Family Medicine

## 2024-02-23 ENCOUNTER — Other Ambulatory Visit: Payer: Self-pay

## 2024-02-23 MED ORDER — METHOCARBAMOL 500 MG PO TABS: 500.0000 mg | ORAL_TABLET | Freq: Three times a day (TID) | ORAL | 1 refills | Status: AC | PRN

## 2024-02-23 NOTE — Progress Notes (Unsigned)
 Patty [name] [facility] [address] [place] [address] Phone: [phone] Subjective:   Patty [name], am serving as a scribe for Dr. Arthea Claudene.  I'm seeing this patient by the request  of:  Cristopher Bottcher, NP  CC: Low back pain  YEP:Dlagzrupcz  [name] is a 48 y.o. female coming in with complaint of back and neck pain. OMT 01/07/2024. Patient suffered from stroke-like symptoms again on 01/19/2024.  Patient states that she is having numbness in coccyx and feels a catching in the lower back.   Cerivcal spine is tight and hard to turn to the right.   Medications patient has been prescribed: Effexor   Taking:         Reviewed prior external information including notes and imaging from previsou exam, outside providers and external EMR if available.   As well as notes that were available from care everywhere and other healthcare systems.  Past medical history, social, surgical and family history all reviewed in electronic medical record.  No pertanent information unless stated regarding to the chief complaint.   Past Medical History:  Diagnosis Date   Chronic back pain    officially undiagnosed   Stroke (HCC)     No Known Allergies   Review of Systems:  No headache, visual changes, nausea, vomiting, diarrhea, constipation, dizziness, abdominal pain, skin rash, fevers, chills, night sweats, weight loss, swollen lymph nodes, body aches, joint swelling, chest pain, shortness of breath, mood changes. POSITIVE muscle aches  Objective  Blood pressure 110/78, pulse 66, height 5' 6 (1.676 m), SpO2 96%.   General: No apparent distress alert and oriented x3 mood and affect normal, dressed appropriately.  HEENT: Pupils equal, extraocular movements intact  Respiratory: Patient's speak in full sentences and does not appear short of breath  Cardiovascular: No lower extremity edema, non tender, no erythema  Gait relatively normal MSK:  Back  low back does have some loss of lordosis.  Some tenderness to palpation in the paraspinal musculature.  Seems to be more at the sacroiliac joint bilaterally.  Tightness with FABER test noted.  Severe tightness of the hip flexors left greater than right.  Osteopathic findings   T3 extended rotated and side bent right inhaled rib T9 extended rotated and side bent left L2 flexed rotated and side bent r left Sacrum right on right       Assessment and Plan:  SI (sacroiliac) joint dysfunction Attempted home exercises, attempted osteopathic manipulation as well after further evaluation and patient did respond well to this.  Did finally get enough movement.  With patient starting to walk after her carotid dissection as I think this is more secondary to tightness of the hip flexors with some mild muscle imbalances.  Discussed with patient about stretching after certain activities.  I am very helpful the patient has made significant strides at this time.  Follow-up with me again 2 months  Dissection of right carotid artery [facility]) Patient is improving it seems significantly.  Still workup has not shown the potential reason for it at the moment.  Likely he is feeling better and better.  Metoprolol  has actually helped her headaches as well.  Blood pressure seems to be well-controlled at this time.    Nonallopathic problems  Decision today to treat with OMT was based on Physical Exam  After verbal consent patient was treated with , ME, FPR techniques in rib, thoracic, lumbar, and sacral  areas  Patient tolerated the procedure well with  improvement in symptoms  Patient given exercises, stretches and lifestyle modifications  See medications in patient instructions if given  Patient will follow up in 4 weeks    The above documentation has been reviewed and is accurate and complete Kerrington Sova M Siobhan Zaro, DO          Note: This dictation was prepared with Dragon dictation along with smaller  phrase technology. Any transcriptional errors that result from this process are unintentional.

## 2024-02-24 ENCOUNTER — Ambulatory Visit: Admitting: Family Medicine

## 2024-02-24 ENCOUNTER — Encounter: Payer: Self-pay | Admitting: Family Medicine

## 2024-02-24 VITALS — BP 110/78 | HR 66 | Ht 66.0 in

## 2024-02-24 DIAGNOSIS — I7771 Dissection of carotid artery: Secondary | ICD-10-CM

## 2024-02-24 DIAGNOSIS — M533 Sacrococcygeal disorders, not elsewhere classified: Secondary | ICD-10-CM | POA: Diagnosis not present

## 2024-02-24 DIAGNOSIS — M9902 Segmental and somatic dysfunction of thoracic region: Secondary | ICD-10-CM

## 2024-02-24 DIAGNOSIS — M9903 Segmental and somatic dysfunction of lumbar region: Secondary | ICD-10-CM

## 2024-02-24 DIAGNOSIS — M9904 Segmental and somatic dysfunction of sacral region: Secondary | ICD-10-CM

## 2024-02-24 NOTE — Patient Instructions (Signed)
 Great to see you I will stop at coffee place Work on stretching hip flexor See me in 2 months

## 2024-02-24 NOTE — Assessment & Plan Note (Signed)
 Attempted home exercises, attempted osteopathic manipulation as well after further evaluation and patient did respond well to this.  Did finally get enough movement.  With patient starting to walk after her carotid dissection as I think this is more secondary to tightness of the hip flexors with some mild muscle imbalances.  Discussed with patient about stretching after certain activities.  I am very helpful the patient has made significant strides at this time.  Follow-up with me again 2 months

## 2024-02-24 NOTE — Assessment & Plan Note (Signed)
 Patient is improving it seems significantly.  Still workup has not shown the potential reason for it at the moment.  Likely he is feeling better and better.  Metoprolol  has actually helped her headaches as well.  Blood pressure seems to be well-controlled at this time.

## 2024-03-01 NOTE — Telephone Encounter (Signed)
 Tried calling [facility] back no answer. LMOVM to call me back to start the Process.

## 2024-03-02 NOTE — Telephone Encounter (Signed)
 Spoke to [facility], Signed document to give them permission to help with the PA for lab test.

## 2024-03-03 ENCOUNTER — Encounter: Admitting: Speech Pathology

## 2024-03-06 ENCOUNTER — Ambulatory Visit: Admitting: Family Medicine

## 2024-03-10 ENCOUNTER — Encounter: Admitting: Speech Pathology

## 2024-03-17 ENCOUNTER — Encounter: Admitting: Speech Pathology

## 2024-03-27 ENCOUNTER — Ambulatory Visit: Admitting: Neurology

## 2024-03-28 ENCOUNTER — Encounter: Payer: Self-pay | Admitting: Family Medicine

## 2024-03-31 ENCOUNTER — Other Ambulatory Visit

## 2024-04-03 ENCOUNTER — Other Ambulatory Visit (HOSPITAL_COMMUNITY): Payer: Self-pay

## 2024-04-03 ENCOUNTER — Other Ambulatory Visit: Payer: Self-pay | Admitting: Family Medicine

## 2024-04-10 ENCOUNTER — Ambulatory Visit: Admitting: Neurology

## 2024-04-11 ENCOUNTER — Telehealth: Payer: Self-pay

## 2024-04-11 ENCOUNTER — Other Ambulatory Visit: Payer: Self-pay | Admitting: Family Medicine

## 2024-04-11 DIAGNOSIS — Z1231 Encounter for screening mammogram for malignant neoplasm of breast: Secondary | ICD-10-CM

## 2024-04-11 NOTE — Patient Outreach (Signed)
 First telephone outreach attempt to obtain mRS. No answer. Left message for returned call.  [facility]  Population Health Care Management Assistant  Direct Dial: [phone]  Fax: [phone] Website: [email or web address]

## 2024-04-12 NOTE — Progress Notes (Unsigned)
 [facility] [address] [place] [address] Phone: [phone] Subjective:   Patty [name], am serving as a scribe for Dr. Arthea Claudene.  I'm seeing this patient by the request  of:  Cristopher Bottcher, NP  CC: Back pain follow-up  YEP:Dlagzrupcz  Patty [name] is a 48 y.o. female coming in with complaint of back  OMT 02/24/2024. Patient states doing well. No new symptoms. Same per usual.  Medications patient has been prescribed: Robaxin   Taking:  Reviewing patient's history of the dissection most recent MRI of the brain did show that patient was making significant improvement.  Scheduled for another CT angio of the neck in October.       Reviewed prior external information including notes and imaging from previsou exam, outside providers and external EMR if available.   As well as notes that were available from care everywhere and other healthcare systems.  Past medical history, social, surgical and family history all reviewed in electronic medical record.  No pertanent information unless stated regarding to the chief complaint.   Past Medical History:  Diagnosis Date   Chronic back pain    officially undiagnosed   Stroke (HCC)     No Known Allergies   Review of Systems:  No headache, visual changes, nausea, vomiting, diarrhea, constipation, dizziness, abdominal pain, skin rash, fevers, chills, night sweats, weight loss, swollen lymph nodes, body aches, joint swelling, chest pain, shortness of breath, mood changes. POSITIVE muscle aches  Objective  Blood pressure 104/72, pulse 67, height 5' 6 (1.676 m), weight 141 lb (64 kg), SpO2 98%.   General: No apparent distress alert and oriented x3 mood and affect normal, dressed appropriately.  HEENT: Pupils equal, extraocular movements intact  Respiratory: Patient's speak in full sentences and does not appear short of breath  Cardiovascular: No lower extremity edema, non tender, no  erythema  Gait relatively normal MSK:  Back tightness noted in the shoulder blades bilaterally.  Still severe tenderness over the sacroiliac joint right greater than left.  Severe tenderness over the sacroiliac joint right greater than left moment.  New worsening pain with extension of the back.  Patient was extremely slow secondary to discomfort.  Osteopathic findings  T3 extended rotated and side bent right inhaled rib T9 extended rotated and side bent left L2 flexed rotated and side bent right Sacrum right on right     Assessment and Plan:  SI (sacroiliac) joint dysfunction Worsening pain at this time and still no reason specifically for the potential dissection that patient had.  Would like to get MRI of the pelvis and lumbar spine to further evaluate for sacroiliitis with patient having worsening discomfort and pain.  Depending on findings this could change medical management fairly significant.  Continue same medications including the Effexor  at the same dose at the moment.  Follow-up with me again after imaging to discuss further.    Nonallopathic problems  Decision today to treat with OMT was based on Physical Exam  After verbal consent patient was treated with HVLA, ME, FPR techniques in  rib, thoracic, lumbar, and sacral  areas  Patient tolerated the procedure well with improvement in symptoms  Patient given exercises, stretches and lifestyle modifications  See medications in patient instructions if given  Patient will follow up in 4-8 weeks    The above documentation has been reviewed and is accurate and complete Patty Cadden M Ashleigh Luckow, DO          Note: This  dictation was prepared with Dragon dictation along with smaller phrase technology. Any transcriptional errors that result from this process are unintentional.

## 2024-04-13 ENCOUNTER — Telehealth: Payer: Self-pay

## 2024-04-13 NOTE — Patient Outreach (Signed)
 Second telephone outreach attempt to obtain mRS. No answer. Left message for returned call.  [facility]  Population Health Care Management Assistant  Direct Dial: [phone]  Fax: [phone] Website: [email or web address]

## 2024-04-14 ENCOUNTER — Telehealth: Payer: Self-pay

## 2024-04-14 ENCOUNTER — Ambulatory Visit: Admitting: Family Medicine

## 2024-04-14 ENCOUNTER — Encounter: Payer: Self-pay | Admitting: Family Medicine

## 2024-04-14 VITALS — BP 104/72 | HR 67 | Ht 66.0 in | Wt 141.0 lb

## 2024-04-14 DIAGNOSIS — M533 Sacrococcygeal disorders, not elsewhere classified: Secondary | ICD-10-CM

## 2024-04-14 DIAGNOSIS — M9904 Segmental and somatic dysfunction of sacral region: Secondary | ICD-10-CM | POA: Diagnosis not present

## 2024-04-14 DIAGNOSIS — M9903 Segmental and somatic dysfunction of lumbar region: Secondary | ICD-10-CM | POA: Diagnosis not present

## 2024-04-14 DIAGNOSIS — M5416 Radiculopathy, lumbar region: Secondary | ICD-10-CM | POA: Diagnosis not present

## 2024-04-14 DIAGNOSIS — M9902 Segmental and somatic dysfunction of thoracic region: Secondary | ICD-10-CM | POA: Diagnosis not present

## 2024-04-14 NOTE — Assessment & Plan Note (Signed)
 Worsening pain at this time and still no reason specifically for the potential dissection that patient had.  Would like to get MRI of the pelvis and lumbar spine to further evaluate for sacroiliitis with patient having worsening discomfort and pain.  Depending on findings this could change medical management fairly significant.  Continue same medications including the Effexor  at the same dose at the moment.  Follow-up with me again after imaging to discuss further.

## 2024-04-14 NOTE — Patient Instructions (Addendum)
 [facility] [phone] Call Today  When we receive your results we will contact you.  See you again in 5-6 weeks

## 2024-04-14 NOTE — Patient Outreach (Signed)
 Telephone outreach to patient to obtain mRS was successfully completed. MRS= 1  Shereen Gin [facility] VBCI Assistant Direct Dial: [phone]  Fax: [phone] Website: [email or web address]

## 2024-04-21 ENCOUNTER — Other Ambulatory Visit: Payer: Self-pay | Admitting: Family Medicine

## 2024-05-08 ENCOUNTER — Encounter: Payer: Self-pay | Admitting: Family Medicine

## 2024-05-15 ENCOUNTER — Other Ambulatory Visit

## 2024-05-16 ENCOUNTER — Ambulatory Visit: Payer: Self-pay | Admitting: Family Medicine

## 2024-05-16 ENCOUNTER — Ambulatory Visit
Admission: RE | Admit: 2024-05-16 | Discharge: 2024-05-16 | Disposition: A | Discharge status: Home / Self Care | Source: Ambulatory Visit | Attending: Family Medicine | Admitting: Family Medicine

## 2024-05-16 DIAGNOSIS — M5416 Radiculopathy, lumbar region: Secondary | ICD-10-CM

## 2024-05-16 MED ORDER — GADOPICLENOL 0.5 MMOL/ML IV SOLN
6.0000 mL | Freq: Once | INTRAVENOUS | Status: AC | PRN
Start: 2024-05-16 — End: 2024-05-16
  Administered 2024-05-16: 6 mL via INTRAVENOUS

## 2024-05-18 ENCOUNTER — Other Ambulatory Visit: Payer: Self-pay

## 2024-05-18 DIAGNOSIS — M5416 Radiculopathy, lumbar region: Secondary | ICD-10-CM

## 2024-05-26 NOTE — Progress Notes (Signed)
  [facility] [address] [place] [address] Phone: [phone] Subjective:   Patty [name], am serving as a scribe for Dr. Arthea Claudene.  I'm seeing this patient by the request  of:  Cristopher Bottcher, NP  CC: Back and neck pain follow-up  YEP:Dlagzrupcz  Patty [name] is a 48 y.o. female coming in with complaint of back and neck pain. OMT 04/14/2024. Patient states less pain but does have to be careful how she is moving to not increase pain.   Medications patient has been prescribed: Robaxin , Effexor   Taking:    MRI lumbar 05/16/2024 IMPRESSION: Disc desiccation L4-5 has slightly progressed. There is a minimal broad-based bulge effacing the ventral thecal sac. There is no significant spinal or foraminal stenosis.  MRI pelvis 05/16/2024 IMPRESSION: 1. No acute findings or explanation for the patient's symptoms. 2. Mild symmetric gluteus tendinosis bilaterally. 3. Lumbar spine findings dictated separately.       Reviewed prior external information including notes and imaging from previsou exam, outside providers and external EMR if available.   As well as notes that were available from care everywhere and other healthcare systems.  Past medical history, social, surgical and family history all reviewed in electronic medical record.  No pertanent information unless stated regarding to the chief complaint.   Past Medical History:  Diagnosis Date   Chronic back pain    officially undiagnosed   Stroke (HCC)     No Known Allergies   Review of Systems:  No headache, visual changes, nausea, vomiting, diarrhea, constipation, dizziness, abdominal pain, skin rash, fevers, chills, night sweats, weight loss, swollen lymph nodes, body aches, joint swelling, chest pain, shortness of breath, mood changes. POSITIVE muscle aches  Objective  Blood pressure 120/76, pulse 61, height 5' 6 (1.676 m), weight 142 lb (64.4 kg), SpO2 98%.   General:  No apparent distress alert and oriented x3 mood and affect normal, dressed appropriately.  HEENT: Pupils equal, extraocular movements intact  Respiratory: Patient's speak in full sentences and does not appear short of breath  Cardiovascular: No lower extremity edema, non tender, no erythema         Assessment and Plan:  L4-L5 disc bulge Patient does have some degenerative disc disease and more of a dislocation.  Discussed with patient about different treatment options at great length today.  Patient has elected to retry the epidural.  I do think that this is probably the most likely to be beneficial for this individual.  Patient is on a blood thinner and will need to hold it but is also having a colonoscopy so we will try to get both of them done.  Follow-up with me again in 6 to 8 weeks otherwise.       The above documentation has been reviewed and is accurate and complete Taryll Reichenberger M Antwaun Buth, DO          Note: This dictation was prepared with Dragon dictation along with smaller phrase technology. Any transcriptional errors that result from this process are unintentional.

## 2024-05-29 ENCOUNTER — Encounter: Payer: Self-pay | Admitting: Family Medicine

## 2024-05-29 ENCOUNTER — Ambulatory Visit
Admission: RE | Admit: 2024-05-29 | Discharge: 2024-05-29 | Disposition: A | Discharge status: Home / Self Care | Source: Ambulatory Visit | Attending: Family Medicine | Admitting: Family Medicine

## 2024-05-29 ENCOUNTER — Ambulatory Visit: Admitting: Family Medicine

## 2024-05-29 VITALS — BP 120/76 | HR 61 | Ht 66.0 in | Wt 142.0 lb

## 2024-05-29 DIAGNOSIS — Z1231 Encounter for screening mammogram for malignant neoplasm of breast: Secondary | ICD-10-CM

## 2024-05-29 DIAGNOSIS — M51369 Other intervertebral disc degeneration, lumbar region without mention of lumbar back pain or lower extremity pain: Secondary | ICD-10-CM | POA: Diagnosis not present

## 2024-05-29 NOTE — Assessment & Plan Note (Signed)
 Patient does have some degenerative disc disease and more of a dislocation.  Discussed with patient about different treatment options at great length today.  Patient has elected to retry the epidural.  I do think that this is probably the most likely to be beneficial for this individual.  Patient is on a blood thinner and will need to hold it but is also having a colonoscopy so we will try to get both of them done.  Follow-up with me again in 6 to 8 weeks otherwise.

## 2024-06-07 ENCOUNTER — Other Ambulatory Visit: Payer: Self-pay | Admitting: Obstetrics and Gynecology

## 2024-06-07 NOTE — H&P (Signed)
 Patty [name] is a 48 yo female,  P: 2-0-0-2, who presents for hysteroscopic polypectomy because of abnormal uterine bleeding on oral contraceptives and endometrial polyps. Over the past 5 years the patient has noticed increased intermenstrual bleeding with her oral contraceptive lasting for 5 days and requiring the change of a panty liner twice  a day.  This bleed would be followed by a regular period lasting 4 days with pad change every hour for the first 2 days then 4 times a day.  Her cramping is minimal and relieved by Aleve.   A transvaginal ultrasound on 02/10/24 revealed: a uterine volume of 87 cc and endometrium: 11.39 mm containing a 1.5 cm hypoechoic nodule with vascularity-likely polyp  and multiple hyperechoic hypervascular nodules suggestive of polyps; both ovaries appeared normal.  The patient denies any missed/late dosing of her birth control pills, dyspareunia, post coital bleeding, vaginitis symptoms or changes in bowel or bladder function.  Given the findings on ultrasound and progression in her intermenstrual bleeding she wishes to proceed with surgical management and placement of a Mirena IUD to minimize recurrence.    Past Medical History  OB History: G: 2;  P: 2-0-0-2; SVB with largest weighing 8 lbs.  GYN History: menarche: 48 yo;     LMP: 05-27-2024;     Contraception oral contraceptives (estrogen/progesterone)  The patient denies history of sexually transmitted disease.  Denies history of abnormal PAP smear.   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, Depression and Lumbar Disc Disease L4-5  Surgical History: none Denies history of blood transfusions  Family History: Breast Cancer, Colon Cancer, Thyroid Disease and Hypertension  Social History:  Married and unemployed;  Denies tobacco use and occasionally uses alcohol   Medications:  Aspirin  81 mg daily Atorvastatin  40 mg  daily Clopidogrel  75 mg daily (suspended during pre/post op period) Gabapentin  300 mg 3 times daily Metoprolol   Tartrate 12.5 mg bid Venlafaxine  37.5 mg daily Valacyclovir 2 grams bid prn Certirizine 10 mg daily  No Known Allergies  Denies sensitivity to peanuts, shellfish, soy, latex or adhesives.   ROS: Admits to glasses, post void dribbling and occasional stress urinary incontinence symptoms but denies headache, vision changes, nasal congestion, dysphagia, tinnitus, dizziness, hoarseness, cough,  chest pain, shortness of breath, nausea, vomiting, diarrhea,constipation,  urinary frequency, urgency  dysuria, hematuria, vaginitis symptoms, pelvic pain, swelling of joints,easy bruising,  myalgias, arthralgias, skin rashes, unexplained weight loss and except as is mentioned in the history of present illness, patient's review of systems is otherwise negative.    Physical Exam  Bp: 110/76;  Weight: 142 lbs.; Height: 5' 6;  BMI: 22.9  Neck: supple without masses or thyromegaly Lungs: clear to auscultation Heart: regular rate and rhythm Abdomen: soft, non-tender and no organomegaly Pelvic:EGBUS- wnl; vagina-normal rugae; uterus-normal size, cervix without lesions or motion tenderness; adnexae-no tenderness or masses Extremities:  no clubbing, cyanosis or edema   Assesment: Abnormal Uterine Bleeding                      Endometrial Polyp   Disposition:  A discussion was held with patient regarding the indication for her procedure(s) along with the risks, which include but are not limited to: reaction to anesthesia, damage to adjacent organs(bladder, bowel and ureters) , infection and excessive bleeding.The patient received written medical clearance from her Neurosurgeon Dr. Zomoradi.  She has consented to proceed with a Hysteroscopic Removal of an Endometrial  Polyp and Placement of a Mirena IUD on July 06, 2024.    CSN# [id number]   Shiza Thelen J. Perri, PA-C  for Dr. Nena LABOR.  [place]

## 2024-06-20 ENCOUNTER — Other Ambulatory Visit: Payer: Self-pay | Admitting: Family Medicine

## 2024-07-03 ENCOUNTER — Encounter (HOSPITAL_COMMUNITY): Payer: Self-pay | Admitting: Obstetrics and Gynecology

## 2024-07-03 NOTE — Progress Notes (Signed)
 Spoke w/ via phone for pre-op interview---Patty [name] needs dos---- CBC, T&S and UPT per surgeon.        [name] results------ COVID test -----patient states asymptomatic no test needed Arrive at -------1130 NPO after MN NO Solid Food.  Clear liquids from MN until---1030 Pre-Surgery Ensure or G2:  Med rec completed Medications to take morning of surgery ----- Gabapentin , Metoprolol  and Effexor . Diabetic medication -----  GLP1 agonist last dose: GLP1 instructions:  Patient instructed no nail polish to be worn day of surgery Patient instructed to bring photo id and insurance card day of surgery Patient aware to have Driver (ride ) / caregiver    for 24 hours after surgery - Husband Patty [name] Patient Special Instructions -----Hold Plavix  5 days prior to procedure per note in Epic by E. Perri, PA-C. Pt verbalized understanding. Medical clearance under media tab dated 06/14/24 by Dr. Zomoradi. Pre-Op special Instructions -----  Patient verbalized understanding of instructions that were given at this phone interview. Patient denies chest pain, sob, fever, cough at the interview.

## 2024-07-06 ENCOUNTER — Ambulatory Visit (HOSPITAL_COMMUNITY): Admitting: Certified Registered"

## 2024-07-06 ENCOUNTER — Other Ambulatory Visit: Payer: Self-pay

## 2024-07-06 ENCOUNTER — Encounter (HOSPITAL_COMMUNITY)
Admission: RE | Disposition: A | Discharge status: Home / Self Care | Payer: Self-pay | Source: Home / Self Care | Attending: Obstetrics and Gynecology

## 2024-07-06 ENCOUNTER — Ambulatory Visit (HOSPITAL_COMMUNITY)
Admission: RE | Admit: 2024-07-06 | Discharge: 2024-07-06 | Disposition: A | Discharge status: Home / Self Care | Attending: Obstetrics and Gynecology | Admitting: Obstetrics and Gynecology

## 2024-07-06 ENCOUNTER — Encounter (HOSPITAL_COMMUNITY): Payer: Self-pay | Admitting: Obstetrics and Gynecology

## 2024-07-06 DIAGNOSIS — I69328 Other speech and language deficits following cerebral infarction: Secondary | ICD-10-CM | POA: Diagnosis not present

## 2024-07-06 DIAGNOSIS — N840 Polyp of corpus uteri: Secondary | ICD-10-CM

## 2024-07-06 DIAGNOSIS — N939 Abnormal uterine and vaginal bleeding, unspecified: Secondary | ICD-10-CM | POA: Diagnosis present

## 2024-07-06 DIAGNOSIS — I10 Essential (primary) hypertension: Secondary | ICD-10-CM | POA: Diagnosis not present

## 2024-07-06 DIAGNOSIS — F418 Other specified anxiety disorders: Secondary | ICD-10-CM | POA: Diagnosis not present

## 2024-07-06 DIAGNOSIS — E785 Hyperlipidemia, unspecified: Secondary | ICD-10-CM | POA: Diagnosis not present

## 2024-07-06 HISTORY — DX: Anxiety disorder, unspecified: F41.9

## 2024-07-06 HISTORY — DX: Depression, unspecified: F32.A

## 2024-07-06 LAB — CBC
HCT: 44.3 % (ref 36.0–46.0)
Hemoglobin: 14 g/dL (ref 12.0–15.0)
MCH: 30 pg (ref 26.0–34.0)
MCHC: 31.6 g/dL (ref 30.0–36.0)
MCV: 94.9 fL (ref 80.0–100.0)
Platelets: 255 K/uL (ref 150–400)
RBC: 4.67 MIL/uL (ref 3.87–5.11)
RDW: 12.3 % (ref 11.5–15.5)
WBC: 6.8 K/uL (ref 4.0–10.5)
nRBC: 0 % (ref 0.0–0.2)

## 2024-07-06 LAB — TYPE AND SCREEN
ABO/RH(D): O NEG
Antibody Screen: NEGATIVE

## 2024-07-06 LAB — ABO/RH: ABO/RH(D): O NEG

## 2024-07-06 LAB — POCT PREGNANCY, URINE: Preg Test, Ur: NEGATIVE

## 2024-07-06 SURGERY — DILATATION & CURETTAGE/HYSTEROSCOPY WITH RESECTOCOPE
Anesthesia: General | Site: Uterus

## 2024-07-06 MED ORDER — DEXAMETHASONE SOD PHOSPHATE PF 10 MG/ML IJ SOLN
INTRAMUSCULAR | Status: DC | PRN
  Administered 2024-07-06: 10 mg via INTRAVENOUS

## 2024-07-06 MED ORDER — SODIUM CHLORIDE (PF) 0.9 % IJ SOLN
INTRAMUSCULAR | Status: AC
  Filled 2024-07-06: qty 100

## 2024-07-06 MED ORDER — LACTATED RINGERS IV SOLN: INTRAVENOUS | Status: DC

## 2024-07-06 MED ORDER — ORAL CARE MOUTH RINSE: 15.0000 mL | Freq: Once | OROMUCOSAL | Status: AC

## 2024-07-06 MED ORDER — GABAPENTIN 300 MG PO CAPS
ORAL_CAPSULE | ORAL | Status: AC
  Filled 2024-07-06: qty 1

## 2024-07-06 MED ORDER — VASOPRESSIN 20 UNIT/ML IV SOLN
INTRAVENOUS | Status: AC
  Filled 2024-07-06: qty 2

## 2024-07-06 MED ORDER — SCOPOLAMINE 1 MG/3DAYS TD PT72
MEDICATED_PATCH | TRANSDERMAL | Status: AC
  Filled 2024-07-06: qty 1

## 2024-07-06 MED ORDER — CELECOXIB 200 MG PO CAPS
ORAL_CAPSULE | ORAL | Status: AC
  Filled 2024-07-06: qty 2

## 2024-07-06 MED ORDER — SODIUM CHLORIDE 0.9 % IR SOLN
Status: DC | PRN
  Administered 2024-07-06: 3000 mL

## 2024-07-06 MED ORDER — MEPERIDINE HCL 25 MG/ML IJ SOLN: 6.2500 mg | INTRAMUSCULAR | Status: DC | PRN

## 2024-07-06 MED ORDER — ACETAMINOPHEN 500 MG PO TABS
1000.0000 mg | ORAL_TABLET | ORAL | Status: AC
  Administered 2024-07-06: 1000 mg via ORAL

## 2024-07-06 MED ORDER — KETOROLAC TROMETHAMINE 30 MG/ML IJ SOLN
INTRAMUSCULAR | Status: AC
  Filled 2024-07-06: qty 1

## 2024-07-06 MED ORDER — PROPOFOL 10 MG/ML IV BOLUS
INTRAVENOUS | Status: DC | PRN
  Administered 2024-07-06: 150 mg via INTRAVENOUS
  Administered 2024-07-06: 50 mg via INTRAVENOUS

## 2024-07-06 MED ORDER — LIDOCAINE 2% (20 MG/ML) 5 ML SYRINGE
INTRAMUSCULAR | Status: AC
  Filled 2024-07-06: qty 5

## 2024-07-06 MED ORDER — LEVONORGESTREL 20 MCG/DAY IU IUD
1.0000 | INTRAUTERINE_SYSTEM | INTRAUTERINE | Status: AC
  Administered 2024-07-06: 1 via INTRAUTERINE

## 2024-07-06 MED ORDER — OXYCODONE HCL 5 MG/5ML PO SOLN: 5.0000 mg | Freq: Once | ORAL | Status: DC | PRN

## 2024-07-06 MED ORDER — LIDOCAINE 2% (20 MG/ML) 5 ML SYRINGE
INTRAMUSCULAR | Status: DC | PRN
  Administered 2024-07-06: 20 mg via INTRAVENOUS

## 2024-07-06 MED ORDER — ESMOLOL HCL 100 MG/10ML IV SOLN
INTRAVENOUS | Status: DC | PRN
  Administered 2024-07-06 (×2): 30 mg via INTRAVENOUS

## 2024-07-06 MED ORDER — PROPOFOL 10 MG/ML IV BOLUS
INTRAVENOUS | Status: AC
  Filled 2024-07-06: qty 20

## 2024-07-06 MED ORDER — ACETAMINOPHEN 500 MG PO TABS
ORAL_TABLET | ORAL | Status: AC
  Filled 2024-07-06: qty 2

## 2024-07-06 MED ORDER — CELECOXIB 200 MG PO CAPS: 400.0000 mg | ORAL_CAPSULE | ORAL | Status: DC

## 2024-07-06 MED ORDER — FENTANYL CITRATE (PF) 100 MCG/2ML IJ SOLN: 25.0000 ug | INTRAMUSCULAR | Status: DC | PRN

## 2024-07-06 MED ORDER — MIDAZOLAM HCL (PF) 2 MG/2ML IJ SOLN
INTRAMUSCULAR | Status: DC | PRN
  Administered 2024-07-06 (×2): 1 mg via INTRAVENOUS

## 2024-07-06 MED ORDER — CHLORHEXIDINE GLUCONATE 0.12 % MT SOLN
OROMUCOSAL | Status: AC
  Filled 2024-07-06: qty 15

## 2024-07-06 MED ORDER — CHLORHEXIDINE GLUCONATE 0.12 % MT SOLN
15.0000 mL | Freq: Once | OROMUCOSAL | Status: AC
  Administered 2024-07-06: 15 mL via OROMUCOSAL

## 2024-07-06 MED ORDER — ONDANSETRON HCL 4 MG/2ML IJ SOLN
INTRAMUSCULAR | Status: DC | PRN
  Administered 2024-07-06: 4 mg via INTRAVENOUS

## 2024-07-06 MED ORDER — CHLOROPROCAINE HCL 1 % IJ SOLN
INTRAMUSCULAR | Status: DC | PRN
  Administered 2024-07-06: 10 mL

## 2024-07-06 MED ORDER — GABAPENTIN 300 MG PO CAPS
300.0000 mg | ORAL_CAPSULE | ORAL | Status: AC
  Administered 2024-07-06: 300 mg via ORAL

## 2024-07-06 MED ORDER — FENTANYL CITRATE (PF) 100 MCG/2ML IJ SOLN
INTRAMUSCULAR | Status: AC
  Filled 2024-07-06: qty 2

## 2024-07-06 MED ORDER — KETOROLAC TROMETHAMINE 30 MG/ML IJ SOLN
INTRAMUSCULAR | Status: DC | PRN
  Administered 2024-07-06: 30 mg via INTRAVENOUS

## 2024-07-06 MED ORDER — MIDAZOLAM HCL 2 MG/2ML IJ SOLN
INTRAMUSCULAR | Status: AC
Start: 2024-07-06 — End: 2024-07-06
  Filled 2024-07-06: qty 2

## 2024-07-06 MED ORDER — ESMOLOL HCL 100 MG/10ML IV SOLN
INTRAVENOUS | Status: AC
  Filled 2024-07-06: qty 10

## 2024-07-06 MED ORDER — OXYCODONE HCL 5 MG PO TABS: 5.0000 mg | ORAL_TABLET | Freq: Once | ORAL | Status: DC | PRN

## 2024-07-06 MED ORDER — LEVONORGESTREL 20 MCG/DAY IU IUD
INTRAUTERINE_SYSTEM | INTRAUTERINE | Status: AC
  Filled 2024-07-06: qty 1

## 2024-07-06 MED ORDER — FENTANYL CITRATE (PF) 250 MCG/5ML IJ SOLN
INTRAMUSCULAR | Status: DC | PRN
Start: 2024-07-06 — End: 2024-07-06
  Administered 2024-07-06: 50 ug via INTRAVENOUS

## 2024-07-06 MED ORDER — ONDANSETRON HCL 4 MG/2ML IJ SOLN
INTRAMUSCULAR | Status: AC
Start: 2024-07-06 — End: 2024-07-06
  Filled 2024-07-06: qty 2

## 2024-07-06 MED ORDER — SCOPOLAMINE 1 MG/3DAYS TD PT72
1.0000 | MEDICATED_PATCH | TRANSDERMAL | Status: DC
  Administered 2024-07-06: 1 mg via TRANSDERMAL

## 2024-07-06 MED ORDER — ENSURE PRE-SURGERY PO LIQD: 296.0000 mL | Freq: Once | ORAL | Status: DC

## 2024-07-06 MED ORDER — POVIDONE-IODINE 10 % EX SWAB: 2.0000 | Freq: Once | CUTANEOUS | Status: DC

## 2024-07-06 MED ORDER — MIDAZOLAM HCL (PF) 2 MG/2ML IJ SOLN: 0.5000 mg | Freq: Once | INTRAMUSCULAR | Status: DC | PRN

## 2024-07-06 SURGICAL SUPPLY — 18 items
CATH ROBINSON RED A/P 16FR (CATHETERS) IMPLANT
COVER MAYO STAND STRL (DRAPES) ×1 IMPLANT
DEVICE MYOSURE LITE (MISCELLANEOUS) IMPLANT
DEVICE MYOSURE REACH (MISCELLANEOUS) IMPLANT
DILATOR CANAL MILEX (MISCELLANEOUS) ×1 IMPLANT
GLOVE ECLIPSE 6.5 STRL STRAW (GLOVE) ×1 IMPLANT
GLOVE SURG UNDER POLY LF SZ7 (GLOVE) ×2 IMPLANT
GOWN STRL REUS W/ TWL LRG LVL3 (GOWN DISPOSABLE) ×1 IMPLANT
KIT PROCED FLUENT PRO FLT212S (KITS) ×1 IMPLANT
KIT TURNOVER KIT B (KITS) ×1 IMPLANT
Mirena IUD IMPLANT
NDL ASPIRATION 22 (NEEDLE) ×1 IMPLANT
NEEDLE ASPIRATION 22 (NEEDLE) ×1 IMPLANT
PACK VAGINAL MINOR WOMEN LF (CUSTOM PROCEDURE TRAY) ×1 IMPLANT
PAD OB MATERNITY 11 LF (PERSONAL CARE ITEMS) ×1 IMPLANT
SEAL ROD LENS SCOPE MYOSURE (ABLATOR) ×1 IMPLANT
TOWEL GREEN STERILE FF (TOWEL DISPOSABLE) ×1 IMPLANT
UNDERPAD 30X36 HEAVY ABSORB (UNDERPADS AND DIAPERS) ×1 IMPLANT

## 2024-07-06 NOTE — Anesthesia Procedure Notes (Signed)
 Procedure Name: LMA Insertion Date/Time: 07/06/2024 12:51 PM  Performed by: Delores Dus, CRNAPre-anesthesia Checklist: Patient identified, Emergency Drugs available, Suction available, Patient being monitored and Timeout performed Patient Re-evaluated:Patient Re-evaluated prior to induction Oxygen Delivery Method: Circle system utilized Preoxygenation: Pre-oxygenation with 100% oxygen Induction Type: IV induction LMA Size: 4.0 Laryngoscope Size: 4 Number of attempts: 2

## 2024-07-06 NOTE — Anesthesia Postprocedure Evaluation (Signed)
 Anesthesia Post Note  Patient: Patty [name]  Procedure(s) Performed: DILATATION & CURETTAGE/HYSTEROSCOPY WITH RESECTOCOPE (Uterus) INSERTION, INTRAUTERINE DEVICE (Uterus)     Patient location during evaluation: PACU Anesthesia Type: General Level of consciousness: awake and alert, patient cooperative and oriented Pain management: pain level controlled Vital Signs Assessment: post-procedure vital signs reviewed and stable Respiratory status: spontaneous breathing, nonlabored ventilation and respiratory function stable Cardiovascular status: blood pressure returned to baseline and stable Postop Assessment: no apparent nausea or vomiting and able to ambulate Anesthetic complications: no   No notable events documented.  Last Vitals:  Vitals:   07/06/24 1400 07/06/24 1434  BP: 133/69 120/77  Pulse: (!) 47 (!) 53  Resp: 14 14  Temp: (!) 36.4 C (!) 36.4 C  SpO2: 98% 100%    Last Pain:  Vitals:   07/06/24 1434  TempSrc:   PainSc: 2                  Nayali Talerico,E. Jeanna Giuffre

## 2024-07-06 NOTE — Interval H&P Note (Signed)
 History and Physical Interval Note:  07/06/2024 11:55 AM  Patty [name]  has presented today for surgery, with the diagnosis of ENDOMETRIAL POLY WITH ABNORMAL UTERINE BLEEDING.  The various methods of treatment have been discussed with the patient and family. After consideration of risks, benefits and other options for treatment, the patient has consented to  Procedure(s) with comments: DILATATION & CURETTAGE/HYSTEROSCOPY WITH RESECTOCOPE (N/A) INSERTION, INTRAUTERINE DEVICE (N/A) - MIRENA as a surgical intervention.  The patient's history has been reviewed, patient examined, no change in status, stable for surgery.  I have reviewed the patient's chart and labs.  Questions were answered to the patient's satisfaction.     Nena A Sadhana Frater

## 2024-07-06 NOTE — Op Note (Signed)
 Preop diagnosis: Abnormal uterine bleeding due to endometrial polyp  Postop diagnosis: same  Anesthesia: IV sedation with LMA  Anesthesiologist: Dr. Kelly Mace  Procedure: Hysteroscopy, resection of endometrial polyps and curettage and insertion of Mirena intra-uterine device  Surgeon: Dr. Nena Chieko Neises  Procedure: After being informed of the planned procedure with possible complications including bleeding, infection and uterine perforation, informed consent was obtained and patient was taken to or #6.  She was given IV sedation anesthesia without complication. She was placed in a dorsal decubitus position, prepped and draped in the sterile fashion. Pelvic exam reveals anteverted uterus with 2 normal adnexa.  A speculum is inserted in the vagina. The cervix was grasped with a tenaculum forcep placed on the anterior lip.We proceed with a paracervical block using 1% Nesacaine, 10 cc. Uterus is sounded at 9.5 cm. The cervix is then easily dilated using Hegar dilator until # 25. This allows for easy placement of an operative  hysteroscope. With perfusion of Normal Saline at a maximum pressure of 80-90 mmHg, we are able to evaluate the entire uterine cavity.  Observation: There is a broad-base endometrial polyp on the posterior wall measuring 2 cm. Otherwise the uterine cavity is normal with 2 visualized tubal ostia.  We proceed with resection of the endometrial polyp using Myosure Lite. We then remove our instrumentation. Using a sharp curette, we proceed with curettage of the endometrial cavity which returns a moderate amount of normal-appearing endometrium.  Instruments are then removed. Instrument and sponge count is complete x2. Estimated blood loss is minimal. Water deficit is 15 cc of Normal Saline.  A Mirena intra-uterine device is inserted per protocol.  The procedure is very well tolerated by the patient who is taken to recovery room in a well and stable condition.  Specimen:  Endometrial polyps and endometrial curettings sent to pathology.

## 2024-07-06 NOTE — Anesthesia Preprocedure Evaluation (Addendum)
 Anesthesia Evaluation  Patient identified by MRN, date of birth, ID band Patient awake    Reviewed: Allergy & Precautions, NPO status , Patient's Chart, lab work & pertinent test results, reviewed documented beta blocker date and time   History of Anesthesia Complications Negative for: history of anesthetic complications  Airway Mallampati: I  TM Distance: >3 FB Neck ROM: Full    Dental  (+) Dental Advisory Given, Teeth Intact   Pulmonary neg pulmonary ROS   breath sounds clear to auscultation       Cardiovascular hypertension, Pt. on medications and Pt. on home beta blockers (-) angina + Peripheral Vascular Disease   Rhythm:Regular Rate:Normal  12/2023 ECHO: EF 50 to 55%.  1.LV EF 54.0 %. The left ventricle has low normal function, no regional wall  motion abnormalities. Left ventricular diastolic parameters were normal.   2. RVF is normal. The right ventricular size is normal.   3. The mitral valve is normal in structure. No evidence of mitral valve regurgitation. No evidence of mitral stenosis.   4. The aortic valve is tricuspid. There is mild calcification of the aortic valve. Aortic valve regurgitation is not visualized. No aortic stenosis is present.     Neuro/Psych   Anxiety Depression    S/p bilat carotid artery dissection CVA (speech issues following Bilat carotid artery dissection 12/2023)    GI/Hepatic negative GI ROS, Neg liver ROS,,,  Endo/Other  negative endocrine ROS    Renal/GU negative Renal ROS     Musculoskeletal   Abdominal   Peds  Hematology plavix    Anesthesia Other Findings   Reproductive/Obstetrics                              Anesthesia Physical Anesthesia Plan  ASA: 3  Anesthesia Plan: General   Post-op Pain Management: Tylenol  PO (pre-op)*   Induction: Intravenous  PONV Risk Score and Plan: 3 and Ondansetron , Dexamethasone  and Scopolamine patch -  Pre-op  Airway Management Planned: LMA  Additional Equipment: None  Intra-op Plan:   Post-operative Plan:   Informed Consent: I have reviewed the patients History and Physical, chart, labs and discussed the procedure including the risks, benefits and alternatives for the proposed anesthesia with the patient or authorized representative who has indicated his/her understanding and acceptance.     Dental advisory given  Plan Discussed with: CRNA and Surgeon  Anesthesia Plan Comments:          Anesthesia Quick Evaluation

## 2024-07-06 NOTE — Discharge Instructions (Addendum)
 POST-OPERATIVE INSTRUCTIONS TO PATIENT  Call [facility] at [phone]  for excessive pain, bleeding or temperature greater than or equal to 100.4 degrees (orally).    No driving for 24 hours. No sexual activity for 1 week Use contraceptive protection for 4 weeks then you may rely on Mirena.  Pain management:  Use Ibuprofen 600 mg with Acetaminophen  1000 mg every 6 hours as needed.        Diet: normal  Bathing: may shower day after surgery  Return to Dr. Darcel on 07/19/24 at 9:15 am   Nena Darcel MD   Post Anesthesia Home Care Instructions  Activity: Get plenty of rest for the remainder of the day. A responsible individual must stay with you for 24 hours following the procedure.  For the next 24 hours, DO NOT: -Drive a car -Advertising copywriter -Drink alcoholic beverages -Take any medication unless instructed by your physician -Make any legal decisions or sign important papers.  Meals: Start with liquid foods such as gelatin or soup. Progress to regular foods as tolerated. Avoid greasy, spicy, heavy foods. If nausea and/or vomiting occur, drink only clear liquids until the nausea and/or vomiting subsides. Call your physician if vomiting continues.  Special Instructions/Symptoms: Your throat may feel dry or sore from the anesthesia or the breathing tube placed in your throat during surgery. If this causes discomfort, gargle with warm salt water. The discomfort should disappear within 24 hours.  If you had a scopolamine patch placed behind your ear for the management of post- operative nausea and/or vomiting:  1. The medication in the patch is effective for 72 hours, after which it should be removed.  Wrap patch in a tissue and discard in the trash. Wash hands thoroughly with soap and water. 2. You may remove the patch earlier than 72 hours if you experience unpleasant side effects which may include dry mouth, dizziness or visual disturbances. 3. Avoid touching the  patch. Wash your hands with soap and water after contact with the patch.    No ibuprofen, Advil, Aleve, Motrin, ketorolac , meloxicam , naproxen, or other NSAIDS until after 7:15 pm today if needed. No acetaminophen /Tylenol  until after 6 pm today if needed.

## 2024-07-06 NOTE — Transfer of Care (Signed)
 Immediate Anesthesia Transfer of Care Note  Patient: Patty [name]  Procedure(s) Performed: DILATATION & CURETTAGE/HYSTEROSCOPY WITH RESECTOCOPE (Uterus) INSERTION, INTRAUTERINE DEVICE (Uterus)  Patient Location: PACU  Anesthesia Type:General  Level of Consciousness: awake, alert , and oriented  Airway & Oxygen Therapy: Patient Spontanous Breathing and Patient connected to face mask oxygen  Post-op Assessment: Report given to RN and Post -op Vital signs reviewed and stable  Post vital signs: Reviewed and stable  Last Vitals:  Vitals Value Taken Time  BP 116/70 07/06/24 13:30  Temp    Pulse 78 07/06/24 13:30  Resp 15 07/06/24 13:30  SpO2 100 % 07/06/24 13:30  Vitals shown include unfiled device data.  Last Pain:  Vitals:   07/06/24 1158  TempSrc: Oral  PainSc: 0-No pain      Patients Stated Pain Goal: 8 (07/06/24 1158)  Complications: No notable events documented.

## 2024-07-07 ENCOUNTER — Encounter (HOSPITAL_COMMUNITY): Payer: Self-pay | Admitting: Obstetrics and Gynecology

## 2024-07-10 LAB — SURGICAL PATHOLOGY

## 2024-07-12 NOTE — Discharge Instructions (Signed)

## 2024-07-13 ENCOUNTER — Ambulatory Visit
Admission: RE | Admit: 2024-07-13 | Discharge: 2024-07-13 | Disposition: A | Discharge status: Home / Self Care | Source: Ambulatory Visit | Attending: Family Medicine | Admitting: Family Medicine

## 2024-07-13 DIAGNOSIS — M5416 Radiculopathy, lumbar region: Secondary | ICD-10-CM

## 2024-07-13 MED ORDER — METHYLPREDNISOLONE ACETATE 40 MG/ML INJ SUSP (RADIOLOG
80.0000 mg | Freq: Once | INTRAMUSCULAR | Status: AC
  Administered 2024-07-13: 80 mg via EPIDURAL

## 2024-07-13 MED ORDER — IOPAMIDOL (ISOVUE-M 200) INJECTION 41%
1.0000 mL | Freq: Once | INTRAMUSCULAR | Status: AC
  Administered 2024-07-13: 1 mL via EPIDURAL

## 2024-09-14 NOTE — Progress Notes (Unsigned)
 " Patty [name] JENI [name] [address] [place] [address] Phone: [phone] Subjective:   Patty [name] am a scribe for Dr. Claudene.   I'm seeing this patient by the request  of:  [name] Pellet, MD  CC: Low back pain follow-up  YEP:Dlagzrupcz  05/29/2024 Patient does have some degenerative disc disease and more of a dislocation.  Discussed with patient about different treatment options at great length today.  Patient has elected to retry the epidural.  I do think that this is probably the most likely to be beneficial for this individual.  Patient is on a blood thinner and will need to hold it but is also having a colonoscopy so we will try to get both of them done.  Follow-up with me again in 6 to 8 weeks otherwise.      Update 09/15/2024 Patty [name] is a 49 y.o. female coming in with complaint of lumbar spine pain. Patient states that her back is not great today. It got better after the epidural but the past couple of weeks it has not been good.  Increasing tightness, any type of movement, even uncomfortable at night.  Radicular symptoms into the left gluteal area.      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, mild difficulty with word formation, no other residual   Past Surgical History:  Procedure Laterality Date   DILATATION & CURRETTAGE/HYSTEROSCOPY WITH RESECTOCOPE N/A 07/06/2024   Procedure: DILATATION & CURETTAGE/HYSTEROSCOPY WITH RESECTOCOPE;  Surgeon: Darcel Pool, MD;  Location: [facility] OR;  Service: Gynecology;  Laterality: N/A;   INTRAUTERINE DEVICE (IUD) INSERTION N/A 07/06/2024   Procedure: INSERTION, INTRAUTERINE DEVICE;  Surgeon: Darcel Pool, MD;  Location: [facility] OR;  Service: Gynecology;  Laterality: N/A;  MIRENA    NO PAST SURGERIES     WISDOM TOOTH EXTRACTION     Social History   Socioeconomic  History   Marital status: Married    Spouse name: Not on file   Number of children: Not on file   Years of education: Not on file   Highest education level: Not on file  Occupational History   Not on file  Tobacco Use   Smoking status: Never   Smokeless tobacco: Never  Vaping Use   Vaping status: Never Used  Substance and Sexual Activity   Alcohol use: Yes    Alcohol/week: 1.0 standard drink of alcohol    Types: 1 Glasses of wine per week   Drug use: No   Sexual activity: Not on file  Other Topics Concern   Not on file  Social History Narrative   Right handed   Social Drivers of Health   Tobacco Use: Low Risk (07/06/2024)   Patient History    Smoking Tobacco Use: Never    Smokeless Tobacco Use: Never    Passive Exposure: Not on file  Financial Resource Strain: Not on file  Food Insecurity: No Food Insecurity (01/03/2024)   Hunger Vital Sign    Worried About Running Out of Food in the Last Year: Never true    Ran Out of Food in the Last Year: Never true  Transportation Needs: No Transportation Needs (01/03/2024)   PRAPARE - Administrator, Civil Service (Medical): No    Lack of Transportation (Non-Medical): No  Physical Activity: Not on file  Stress:  Not on file  Social Connections: Not on file  Depression (EYV7-0): Not on file  Alcohol Screen: Not on file  Housing: Unknown (03/21/2024)   Received from [facility]   Epic    Unable to Pay for Housing in the Last Year: Not on file    Number of Times Moved in the Last Year: Not on file    At any time in the past 12 months, were you homeless or living in a shelter (including now)?: No  Utilities: Not At Risk (01/03/2024)   [facility] Utilities    Threatened with loss of utilities: No  Health Literacy: Not on file   Allergies[1] Family History  Problem Relation Age of Onset   Breast cancer Mother     Current Outpatient Medications (Cardiovascular):    atorvastatin  (LIPITOR) 40 MG tablet, Take  1 tablet (40 mg total) by mouth daily.   metoprolol  tartrate (LOPRESSOR ) 25 MG tablet, Take 0.5 tablets (12.5 mg total) by mouth 2 (two) times daily.  Current Outpatient Medications (Respiratory):    cetirizine (ZYRTEC) 5 MG tablet, Take 5 mg by mouth daily.  Current Outpatient Medications (Analgesics):    acetaminophen  (TYLENOL ) 500 MG tablet, Take 1,000 mg by mouth every 6 (six) hours as needed for mild pain (pain score 1-3), moderate pain (pain score 4-6) or headache.   aspirin  EC 81 MG tablet, Take 1 tablet (81 mg total) by mouth daily. Swallow whole.  Current Outpatient Medications (Hematological):    clopidogrel  (PLAVIX ) 75 MG tablet, Take 1 tablet (75 mg total) by mouth daily.  Current Outpatient Medications (Other):    gabapentin  (NEURONTIN ) 300 MG capsule, Take 1 capsule (300 mg total) by mouth 3 (three) times daily.   magnesium  oxide (MAG-OX) 400 (240 Mg) MG tablet, Take 400 mg by mouth at bedtime.   methocarbamol  (ROBAXIN ) 500 MG tablet, Take 1 tablet (500 mg total) by mouth every 8 (eight) hours as needed for muscle spasms.   venlafaxine  XR (EFFEXOR -XR) 37.5 MG 24 hr capsule, TAKE 1 CAPSULE BY MOUTH DAILY WITH BREAKFAST.   Reviewed prior external information including notes and imaging from  primary care provider As well as notes that were available from care everywhere and other healthcare systems.  Past medical history, social, surgical and family history all reviewed in electronic medical record.  No pertanent information unless stated regarding to the chief complaint.   Review of Systems:  No headache, visual changes, nausea, vomiting, diarrhea, constipation, dizziness, abdominal pain, skin rash, fevers, chills, night sweats, weight loss, swollen lymph nodes, body aches, joint swelling, chest pain, shortness of breath, mood changes. POSITIVE muscle aches  Objective  Blood pressure 122/80, pulse 78, height 5' 6 (1.676 m), weight 145 lb (65.8 kg), SpO2 98%.   General:  No apparent distress alert and oriented x3 mood and affect normal, dressed appropriately.  HEENT: Pupils equal, extraocular movements intact  Respiratory: Patient's speak in full sentences and does not appear short of breath  Cardiovascular: No lower extremity edema, non tender, no erythema  Tenderness to palpation in the paraspinal musculature mostly in the L4-L5 area left greater than right.  Worsening pain with extension.  Osteopathic findings T9 extended rotated and side bent left L2 flexed rotated and side bent right L3 flexed rotated and side bent left L4 flexed rotated and side bent left Sacrum right on right     Impression and Recommendations:     L4-L5 disc bulge Unfortunately continues to have the disc bulge.  Patient continues  having pain that is fairly significant overall.  Discussed with patient at great length and patient would like to try another epidural.  Attempted muscle energy techniques on the lower back that did get some increased range of motion but continued to have discomfort and pain.  Hopefully patient will make significant improvement with the second epidural.  Could consider the possibility of some facet injections but again I do feel that there is some more radicular symptoms and consistent with the disc bulging.  Discussed if this did not work need to consider the possibility of microdiscectomy.  Follow-up with me again though in 8 weeks    Decision today to treat with OMT was based on Physical Exam  After verbal consent patient was treated with HVLA, ME, FPR techniques in  thoracic,  lumbar and sacral areas, all areas are chronic   Patient tolerated the procedure well with improvement in symptoms  Patient given exercises, stretches and lifestyle modifications  See medications in patient instructions if given  Patient will follow up in 6-8 weeks  The above documentation has been reviewed and is accurate and complete Ressie Slevin M Caleigha Zale, DO      [1] No  Known Allergies  "

## 2024-09-15 ENCOUNTER — Encounter: Payer: Self-pay | Admitting: Family Medicine

## 2024-09-15 ENCOUNTER — Ambulatory Visit: Admitting: Family Medicine

## 2024-09-15 VITALS — BP 122/80 | HR 78 | Ht 66.0 in | Wt 145.0 lb

## 2024-09-15 DIAGNOSIS — M51369 Other intervertebral disc degeneration, lumbar region without mention of lumbar back pain or lower extremity pain: Secondary | ICD-10-CM

## 2024-09-15 DIAGNOSIS — M9903 Segmental and somatic dysfunction of lumbar region: Secondary | ICD-10-CM | POA: Diagnosis not present

## 2024-09-15 DIAGNOSIS — M9902 Segmental and somatic dysfunction of thoracic region: Secondary | ICD-10-CM | POA: Diagnosis not present

## 2024-09-15 DIAGNOSIS — M9904 Segmental and somatic dysfunction of sacral region: Secondary | ICD-10-CM

## 2024-09-15 DIAGNOSIS — M5416 Radiculopathy, lumbar region: Secondary | ICD-10-CM | POA: Diagnosis not present

## 2024-09-15 NOTE — Assessment & Plan Note (Signed)
 Unfortunately continues to have the disc bulge.  Patient continues having pain that is fairly significant overall.  Discussed with patient at great length and patient would like to try another epidural.  Attempted muscle energy techniques on the lower back that did get some increased range of motion but continued to have discomfort and pain.  Hopefully patient will make significant improvement with the second epidural.  Could consider the possibility of some facet injections but again I do feel that there is some more radicular symptoms and consistent with the disc bulging.  Discussed if this did not work need to consider the possibility of microdiscectomy.  Follow-up with me again though in 8 weeks

## 2024-09-15 NOTE — Patient Instructions (Addendum)
 Epidural [phone] See me in 7-8 weeks

## 2024-09-27 ENCOUNTER — Telehealth: Payer: Self-pay

## 2024-09-27 NOTE — Telephone Encounter (Signed)
 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 She is being followed by neurology at [facility]. They will have to address it with them

## 2024-09-27 NOTE — Telephone Encounter (Signed)
 Patient supposed to have a LP with Epidural but patient has to be off of blood thinner.  Per Wilbern at [facility] the provider wanted DRI to reach out to [facility] to see if it is safe for the patient to be off of her Blood thinner.  Myles Wilbern that the patient never showed for her Follow up in August and she never had her CTA head and neck ordered last year.   Will speak to [facility]. Patient may need to do a follow up for evaluation.   Please advise.

## 2024-11-10 ENCOUNTER — Ambulatory Visit: Admitting: Family Medicine
